# Patient Record
Sex: Female | Born: 1975 | Race: White | Hispanic: No | Marital: Married
Health system: Southern US, Community
[De-identification: ages and names within clinical notes are randomized; demographics above are authoritative.]

## PROBLEM LIST (undated history)

## (undated) DIAGNOSIS — U071 COVID-19: Secondary | ICD-10-CM

## (undated) DIAGNOSIS — J9621 Acute and chronic respiratory failure with hypoxia: Secondary | ICD-10-CM

## (undated) DIAGNOSIS — J449 Chronic obstructive pulmonary disease, unspecified: Secondary | ICD-10-CM

## (undated) DIAGNOSIS — J4489 Other specified chronic obstructive pulmonary disease: Secondary | ICD-10-CM

## (undated) DIAGNOSIS — J1282 Pneumonia due to coronavirus disease 2019: Secondary | ICD-10-CM

## (undated) DIAGNOSIS — J80 Acute respiratory distress syndrome: Secondary | ICD-10-CM

---

## 2019-11-10 ENCOUNTER — Inpatient Hospital Stay
Admit: 2019-11-10 | Discharge: 2019-12-04 | Disposition: A | Discharge status: Rehabilitation Facility | Payer: Managed Care, Other (non HMO) | Source: Ambulatory Visit | Attending: Internal Medicine | Admitting: Internal Medicine

## 2019-11-10 ENCOUNTER — Other Ambulatory Visit (HOSPITAL_COMMUNITY): Payer: Managed Care, Other (non HMO)

## 2019-11-10 ENCOUNTER — Institutional Professional Consult (permissible substitution) (HOSPITAL_COMMUNITY): Payer: Managed Care, Other (non HMO)

## 2019-11-10 DIAGNOSIS — J80 Acute respiratory distress syndrome: Secondary | ICD-10-CM | POA: Diagnosis present

## 2019-11-10 DIAGNOSIS — R1110 Vomiting, unspecified: Secondary | ICD-10-CM

## 2019-11-10 DIAGNOSIS — R109 Unspecified abdominal pain: Secondary | ICD-10-CM

## 2019-11-10 DIAGNOSIS — J45902 Unspecified asthma with status asthmaticus: Secondary | ICD-10-CM | POA: Diagnosis present

## 2019-11-10 DIAGNOSIS — J9621 Acute and chronic respiratory failure with hypoxia: Secondary | ICD-10-CM | POA: Diagnosis present

## 2019-11-10 DIAGNOSIS — R110 Nausea: Secondary | ICD-10-CM

## 2019-11-10 DIAGNOSIS — U071 COVID-19: Secondary | ICD-10-CM | POA: Diagnosis present

## 2019-11-10 DIAGNOSIS — J449 Chronic obstructive pulmonary disease, unspecified: Secondary | ICD-10-CM | POA: Diagnosis present

## 2019-11-10 DIAGNOSIS — Z431 Encounter for attention to gastrostomy: Secondary | ICD-10-CM

## 2019-11-10 HISTORY — DX: Acute respiratory distress syndrome: J80

## 2019-11-10 HISTORY — DX: Pneumonia due to coronavirus disease 2019: J12.82

## 2019-11-10 HISTORY — DX: Other specified chronic obstructive pulmonary disease: J44.89

## 2019-11-10 HISTORY — DX: Chronic obstructive pulmonary disease, unspecified: J44.9

## 2019-11-10 HISTORY — DX: COVID-19: U07.1

## 2019-11-10 HISTORY — DX: Acute and chronic respiratory failure with hypoxia: J96.21

## 2019-11-11 DIAGNOSIS — J80 Acute respiratory distress syndrome: Secondary | ICD-10-CM | POA: Diagnosis not present

## 2019-11-11 DIAGNOSIS — U071 COVID-19: Secondary | ICD-10-CM | POA: Diagnosis not present

## 2019-11-11 DIAGNOSIS — J1282 Pneumonia due to coronavirus disease 2019: Secondary | ICD-10-CM

## 2019-11-11 DIAGNOSIS — J9621 Acute and chronic respiratory failure with hypoxia: Secondary | ICD-10-CM | POA: Diagnosis not present

## 2019-11-11 DIAGNOSIS — J449 Chronic obstructive pulmonary disease, unspecified: Secondary | ICD-10-CM | POA: Diagnosis not present

## 2019-11-11 LAB — BLOOD GAS, ARTERIAL
Acid-Base Excess: 3.7 mmol/L — ABNORMAL HIGH (ref 0.0–2.0)
Bicarbonate: 28.7 mmol/L — ABNORMAL HIGH (ref 20.0–28.0)
FIO2: 45
O2 Saturation: 94.9 %
Patient temperature: 37
pCO2 arterial: 51.5 mmHg — ABNORMAL HIGH (ref 32.0–48.0)
pH, Arterial: 7.365 (ref 7.350–7.450)
pO2, Arterial: 77.7 mmHg — ABNORMAL LOW (ref 83.0–108.0)

## 2019-11-11 LAB — CBC WITH DIFFERENTIAL/PLATELET
Abs Immature Granulocytes: 0.68 10*3/uL — ABNORMAL HIGH (ref 0.00–0.07)
Basophils Absolute: 0.1 10*3/uL (ref 0.0–0.1)
Basophils Relative: 1 %
Eosinophils Absolute: 0.4 10*3/uL (ref 0.0–0.5)
Eosinophils Relative: 3 %
HCT: 32.2 % — ABNORMAL LOW (ref 36.0–46.0)
Hemoglobin: 9.8 g/dL — ABNORMAL LOW (ref 12.0–15.0)
Immature Granulocytes: 5 %
Lymphocytes Relative: 13 %
Lymphs Abs: 1.7 10*3/uL (ref 0.7–4.0)
MCH: 28.7 pg (ref 26.0–34.0)
MCHC: 30.4 g/dL (ref 30.0–36.0)
MCV: 94.2 fL (ref 80.0–100.0)
Monocytes Absolute: 0.9 10*3/uL (ref 0.1–1.0)
Monocytes Relative: 7 %
Neutro Abs: 9.9 10*3/uL — ABNORMAL HIGH (ref 1.7–7.7)
Neutrophils Relative %: 71 %
Platelets: 310 10*3/uL (ref 150–400)
RBC: 3.42 MIL/uL — ABNORMAL LOW (ref 3.87–5.11)
RDW: 13.9 % (ref 11.5–15.5)
WBC: 13.6 10*3/uL — ABNORMAL HIGH (ref 4.0–10.5)
nRBC: 0.2 % (ref 0.0–0.2)

## 2019-11-11 LAB — BASIC METABOLIC PANEL
Anion gap: 11 (ref 5–15)
BUN: 12 mg/dL (ref 6–20)
CO2: 26 mmol/L (ref 22–32)
Calcium: 9 mg/dL (ref 8.9–10.3)
Chloride: 104 mmol/L (ref 98–111)
Creatinine, Ser: 0.55 mg/dL (ref 0.44–1.00)
GFR calc Af Amer: >60 mL/min (ref >60)
GFR calc non Af Amer: >60 mL/min (ref >60)
Glucose, Bld: 133 mg/dL — ABNORMAL HIGH (ref 70–99)
Potassium: 3.9 mmol/L (ref 3.5–5.1)
Sodium: 141 mmol/L (ref 135–145)

## 2019-11-11 LAB — HEMOGLOBIN A1C
Hgb A1c MFr Bld: 5.8 % — ABNORMAL HIGH (ref 4.8–5.6)
Mean Plasma Glucose: 119.76 mg/dL

## 2019-11-11 LAB — VANCOMYCIN, RANDOM: Vancomycin Rm: <4

## 2019-11-11 NOTE — Consult Note (Signed)
Pulmonary Critical Care Medicine [facility] [place]  PULMONARY SERVICE  Date of Service: 11/11/2019  PULMONARY CRITICAL CARE CONSULT   Diana Bass  DXI:[id number]  DOB: 1976/01/19   DOA: 11/10/2019  Referring Physician: Carron Curie, MD  HPI: Diana Bass is a 44 y.o. female seen for follow up of Acute on Chronic Respiratory Failure.  Patient has multiple medical problems including chronic asthma Raynaud's phenomenon presented to the hospital because of increasing shortness of breath cough congestion.  Patient was diagnosed with COVID-19.  The patient was given prednisone initially however then had severe hypoxia leading back to the emergency department.  She was started on Decadron duo nebs albuterol and discharged once again presented back to the hospital decompensated and was initially placed on high flow then BiPAP and then intubated.  Subsequently she was not able to come off the ventilator despite having received Decadron convalescent plasma remdesivir azithromycin.  She ended up with a tracheostomy as well as a PEG and transferred to our facility for further management  Review of Systems:  ROS performed and is unremarkable other than noted above.  Past medical history: Asthma Raynaud's phenomenon COVID-19 Respiratory failure  Past surgical history: Tracheostomy PEG  Social history: Non-smoker No alcohol or drug abuse  Family history: Positive for Covid after Christmas apparently and several family members  Allergies: No known drug allergies  Medications: Reviewed on Rounds  Physical Exam:  Vitals: Temperature 97.8 pulse 114 respiratory rate 35 blood pressure is 163/60 saturations 100%  Ventilator Settings mode of ventilation assist control FiO2 45% tidal volume 450 PEEP 8  . General: Comfortable at this time . Eyes: Grossly normal lids, irises & conjunctiva . ENT: grossly tongue is normal . Neck: no obvious mass . Cardiovascular:  S1-S2 normal no gallop or rub . Respiratory: No rhonchi coarse breath sounds . Abdomen: Soft and nontender . Skin: no rash seen on limited exam . Musculoskeletal: not rigid . Psychiatric:unable to assess . Neurologic: no seizure no involuntary movements         Labs on Admission:  Basic Metabolic Panel: Recent Labs  Lab 11/11/19 0508  NA 141  K 3.9  CL 104  CO2 26  GLUCOSE 133*  BUN 12  CREATININE 0.55  CALCIUM 9.0    Recent Labs  Lab 11/11/19 0323  PHART 7.365  PCO2ART 51.5*  PO2ART 77.7*  HCO3 28.7*  O2SAT 94.9    Liver Function Tests: No results for input(s): AST, ALT, ALKPHOS, BILITOT, PROT, ALBUMIN in the last 168 hours. No results for input(s): LIPASE, AMYLASE in the last 168 hours. No results for input(s): AMMONIA in the last 168 hours.  CBC: Recent Labs  Lab 11/11/19 0508  WBC 13.6*  NEUTROABS 9.9*  HGB 9.8*  HCT 32.2*  MCV 94.2  PLT 310    Cardiac Enzymes: No results for input(s): CKTOTAL, CKMB, CKMBINDEX, TROPONINI in the last 168 hours.  BNP (last 3 results) No results for input(s): BNP in the last 8760 hours.  ProBNP (last 3 results) No results for input(s): PROBNP in the last 8760 hours.   Radiological Exams on Admission: DG ABDOMEN PEG TUBE LOCATION  Result Date: 11/10/2019 CLINICAL DATA:  Peg tube placement EXAM: ABDOMEN - 1 VIEW COMPARISON:  None. FINDINGS: Contrast material injected through the gastrostomy tube outlines the stomach and duodenum. IMPRESSION: Intraluminal position of gastrostomy tube. Electronically Signed   By: Deatra Robinson M.D.   On: 11/10/2019 19:41   DG CHEST PORT 1 VIEW  Result  Date: 11/10/2019 CLINICAL DATA:  COVID-19 infection EXAM: PORTABLE CHEST 1 VIEW COMPARISON:  None. FINDINGS: Tracheostomy tube tip is at the level of the clavicular heads. There are bilateral pulmonary opacities superimposed on shallow lung inflation. Cardiomediastinal contours are normal. Right subclavian approach central venous catheter  tip projects over the right atrium. IMPRESSION: 1. Bilateral pulmonary opacities superimposed on shallow lung inflation, consistent with reported diagnosis of COVID-19 pneumonia. 2. Tracheostomy tube tip at the level of the clavicular heads. 3. Central venous catheter tip projects within the right atrium. Electronically Signed   By: Ulyses Jarred M.D.   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 Acute on chronic respiratory failure with hypoxia plan is to continue with attempting weaning.  This morning patient was attempted on the NAG and did not tolerate.  Respiratory therapy will reassess again later today.  Last chest x-ray also shows bilateral opacities consistent with COVID-19 so there may be some time required for resolution 2. COVID-19 virus infection in resolution phase she still has significant respiratory deficits with bilateral pulmonary opacities and will require time to improve 3. Chronic obstructive asthma nebulizers as necessary.  Would prefer standing order 4. COVID-19 pneumonia treated we will continue with supportive care follow-up x-ray still showing significant infiltrates as noted above.  I have personally seen and evaluated the patient, evaluated laboratory and imaging results, formulated the assessment and plan and placed orders. The Patient requires high complexity decision making with multiple systems involvement.  Case was discussed on Rounds with the Respiratory Therapy Director and the Respiratory staff Time Spent 67minutes  Keyonna Comunale A Janavia Rottman, MD [facility] Pulmonary Critical Care Medicine Sleep  Medicine

## 2019-11-12 ENCOUNTER — Encounter: Payer: Self-pay | Admitting: Internal Medicine

## 2019-11-12 DIAGNOSIS — J449 Chronic obstructive pulmonary disease, unspecified: Secondary | ICD-10-CM | POA: Diagnosis not present

## 2019-11-12 DIAGNOSIS — U071 COVID-19: Secondary | ICD-10-CM | POA: Diagnosis not present

## 2019-11-12 DIAGNOSIS — J80 Acute respiratory distress syndrome: Secondary | ICD-10-CM | POA: Diagnosis not present

## 2019-11-12 DIAGNOSIS — J45902 Unspecified asthma with status asthmaticus: Secondary | ICD-10-CM

## 2019-11-12 DIAGNOSIS — J9621 Acute and chronic respiratory failure with hypoxia: Secondary | ICD-10-CM | POA: Diagnosis not present

## 2019-11-12 DIAGNOSIS — J1282 Pneumonia due to coronavirus disease 2019: Secondary | ICD-10-CM | POA: Diagnosis present

## 2019-11-12 NOTE — Progress Notes (Signed)
Pulmonary Critical Care Medicine [facility] [place]   PULMONARY CRITICAL CARE SERVICE  PROGRESS NOTE  Date of Service: 11/12/2019  Diana Bass  FYB:[id number]  DOB: 08-20-1976   DOA: 11/10/2019  Referring Physician: Carron Curie, MD  HPI: Diana Bass is a 44 y.o. female seen for follow up of Acute on Chronic Respiratory Failure.  Patient currently was attempted on the NAG but she did not tolerated.  She is having some tachypnea.  Spoke to her at length she does have a great deal of anxiety issues also.  After talking to her she did appear to calm down a little bit and we switched her over to pressure support which she was tolerating fairly well  Medications: Reviewed on Rounds  Physical Exam:  Vitals: Temperature 98.1 pulse 83 respiratory 24 blood pressure is 142/67 saturations 99%  Ventilator Settings on assist control FiO2 40% tidal volume 475 PEEP 8  . General: Comfortable at this time . Eyes: Grossly normal lids, irises & conjunctiva . ENT: grossly tongue is normal . Neck: no obvious mass . Cardiovascular: S1 S2 normal no gallop . Respiratory: No rhonchi no rales are noted at this time . Abdomen: soft . Skin: no rash seen on limited exam . Musculoskeletal: not rigid . Psychiatric:unable to assess . Neurologic: no seizure no involuntary movements         Lab Data:   Basic Metabolic Panel: Recent Labs  Lab 11/11/19 0508  NA 141  K 3.9  CL 104  CO2 26  GLUCOSE 133*  BUN 12  CREATININE 0.55  CALCIUM 9.0    ABG: Recent Labs  Lab 11/11/19 0323  PHART 7.365  PCO2ART 51.5*  PO2ART 77.7*  HCO3 28.7*  O2SAT 94.9    Liver Function Tests: No results for input(s): AST, ALT, ALKPHOS, BILITOT, PROT, ALBUMIN in the last 168 hours. No results for input(s): LIPASE, AMYLASE in the last 168 hours. No results for input(s): AMMONIA in the last 168 hours.  CBC: Recent Labs  Lab 11/11/19 0508  WBC 13.6*  NEUTROABS 9.9*  HGB 9.8*  HCT  32.2*  MCV 94.2  PLT 310    Cardiac Enzymes: No results for input(s): CKTOTAL, CKMB, CKMBINDEX, TROPONINI in the last 168 hours.  BNP (last 3 results) No results for input(s): BNP in the last 8760 hours.  ProBNP (last 3 results) No results for input(s): PROBNP in the last 8760 hours.  Radiological Exams: DG ABDOMEN PEG TUBE LOCATION  Result Date: 11/10/2019 CLINICAL DATA:  Peg tube placement EXAM: ABDOMEN - 1 VIEW COMPARISON:  None. FINDINGS: Contrast material injected through the gastrostomy tube outlines the stomach and duodenum. IMPRESSION: Intraluminal position of gastrostomy tube. Electronically Signed   By: Deatra Robinson M.D.   On: 11/10/2019 19:41   DG CHEST PORT 1 VIEW  Result Date: 11/10/2019 CLINICAL DATA:  COVID-19 infection EXAM: PORTABLE CHEST 1 VIEW COMPARISON:  None. FINDINGS: Tracheostomy tube tip is at the level of the clavicular heads. There are bilateral pulmonary opacities superimposed on shallow lung inflation. Cardiomediastinal contours are normal. Right subclavian approach central venous catheter tip projects over the right atrium. IMPRESSION: 1. Bilateral pulmonary opacities superimposed on shallow lung inflation, consistent with reported diagnosis of COVID-19 pneumonia. 2. Tracheostomy tube tip at the level of the clavicular heads. 3. Central venous catheter tip projects within the right atrium. Electronically Signed   By: Deatra Robinson M.D.   On: 11/10/2019 19:43    Assessment/Plan Active Problems:   Acute on chronic  respiratory failure with hypoxia ([place])   COVID-19 virus infection   Pneumonia due to COVID-19 virus   Acute respiratory distress syndrome (ARDS) due to COVID-19 virus (HCC)   Chronic obstructive asthma (with obstructive pulmonary disease) ([place])   1. Acute on chronic respiratory failure with hypoxia plan is to continue with the NAG titrate as tolerated 2. COVID-19 virus infection at baseline we will continue with present management 3. COVID-19  pneumonia slow resolution with persistent changes on the x-ray 4. ARDS slowly improving oxygen requirements are improving 5. Chronic obstructive asthma at baseline we will continue with supportive care   I have personally seen and evaluated the patient, evaluated laboratory and imaging results, formulated the assessment and plan and placed orders. The Patient requires high complexity decision making with multiple systems involvement.  Rounds were done with the Respiratory Therapy Director and Staff therapists and discussed with nursing staff also.  Time 35 minutes  Allyne Gee, MD [facility] Pulmonary Critical Care Medicine Sleep Medicine

## 2019-11-13 DIAGNOSIS — J80 Acute respiratory distress syndrome: Secondary | ICD-10-CM | POA: Diagnosis not present

## 2019-11-13 DIAGNOSIS — U071 COVID-19: Secondary | ICD-10-CM | POA: Diagnosis not present

## 2019-11-13 DIAGNOSIS — J9621 Acute and chronic respiratory failure with hypoxia: Secondary | ICD-10-CM | POA: Diagnosis not present

## 2019-11-13 DIAGNOSIS — J449 Chronic obstructive pulmonary disease, unspecified: Secondary | ICD-10-CM | POA: Diagnosis not present

## 2019-11-13 LAB — VANCOMYCIN, TROUGH: Vancomycin Tr: 16 ug/mL (ref 15–20)

## 2019-11-13 NOTE — Progress Notes (Signed)
Pulmonary Critical Care Medicine [facility] [place]   PULMONARY CRITICAL CARE SERVICE  PROGRESS NOTE  Date of Service: 11/13/2019  Diana Bass  GYK:[id number]  DOB: 08-Oct-1976   DOA: 11/10/2019  Referring Physician: Carron Curie, MD  HPI: Diana Bass is a 44 y.o. female seen for follow up of Acute on Chronic Respiratory Failure.  Patient right now is on pressure support or has been on 40% FiO2 16/8  Medications: Reviewed on Rounds  Physical Exam:  Vitals: Temperature is 98.4 pulse 109 respiratory rate 34 blood pressure is 158/72 saturations 97%  Ventilator Settings mode ventilation pressure support FiO2 40% pressure support 16 PEEP 8  . General: Comfortable at this time . Eyes: Grossly normal lids, irises & conjunctiva . ENT: grossly tongue is normal . Neck: no obvious mass . Cardiovascular: S1 S2 normal no gallop . Respiratory: No rhonchi no rales are noted at this time . Abdomen: soft . Skin: no rash seen on limited exam . Musculoskeletal: not rigid . Psychiatric:unable to assess . Neurologic: no seizure no involuntary movements         Lab Data:   Basic Metabolic Panel: Recent Labs  Lab 11/11/19 0508  NA 141  K 3.9  CL 104  CO2 26  GLUCOSE 133*  BUN 12  CREATININE 0.55  CALCIUM 9.0    ABG: Recent Labs  Lab 11/11/19 0323  PHART 7.365  PCO2ART 51.5*  PO2ART 77.7*  HCO3 28.7*  O2SAT 94.9    Liver Function Tests: No results for input(s): AST, ALT, ALKPHOS, BILITOT, PROT, ALBUMIN in the last 168 hours. No results for input(s): LIPASE, AMYLASE in the last 168 hours. No results for input(s): AMMONIA in the last 168 hours.  CBC: Recent Labs  Lab 11/11/19 0508  WBC 13.6*  NEUTROABS 9.9*  HGB 9.8*  HCT 32.2*  MCV 94.2  PLT 310    Cardiac Enzymes: No results for input(s): CKTOTAL, CKMB, CKMBINDEX, TROPONINI in the last 168 hours.  BNP (last 3 results) No results for input(s): BNP in the last 8760 hours.  ProBNP  (last 3 results) No results for input(s): PROBNP in the last 8760 hours.  Radiological Exams: No results found.  Assessment/Plan Active Problems:   Acute on chronic respiratory failure with hypoxia (HCC)   COVID-19 virus infection   Pneumonia due to COVID-19 virus   Acute respiratory distress syndrome (ARDS) due to COVID-19 virus (HCC)   Chronic obstructive asthma (with obstructive pulmonary disease) (HCC)   1. Acute on chronic respiratory failure hypoxia plan is to continue with the pressure support weaning and advance further today we will try on the NAG 2. COVID-19 virus infection treated in resolution phase 3. Pneumonia due to COVID-19 we will continue with supportive care follow radiologically 4. ARDS slow to improve 5. Chronic asthma patient will be continued on her inhalers as per RT   I have personally seen and evaluated the patient, evaluated laboratory and imaging results, formulated the assessment and plan and placed orders. The Patient requires high complexity decision making with multiple systems involvement.  Rounds were done with the Respiratory Therapy Director and Staff therapists and discussed with nursing staff also.  Time 35 minutes  Yevonne Pax, MD [facility] Pulmonary Critical Care Medicine Sleep Medicine

## 2019-11-14 DIAGNOSIS — J449 Chronic obstructive pulmonary disease, unspecified: Secondary | ICD-10-CM | POA: Diagnosis not present

## 2019-11-14 DIAGNOSIS — J80 Acute respiratory distress syndrome: Secondary | ICD-10-CM | POA: Diagnosis not present

## 2019-11-14 DIAGNOSIS — J9621 Acute and chronic respiratory failure with hypoxia: Secondary | ICD-10-CM | POA: Diagnosis not present

## 2019-11-14 DIAGNOSIS — U071 COVID-19: Secondary | ICD-10-CM | POA: Diagnosis not present

## 2019-11-14 NOTE — Progress Notes (Signed)
Pulmonary Critical Care Medicine [facility] [place]   PULMONARY CRITICAL CARE SERVICE  PROGRESS NOTE  Date of Service: 11/14/2019  Diana Bass  NLG:[id number]  DOB: May 18, 1976   DOA: 11/10/2019  Referring Physician: Carron Curie, MD  HPI: Diana Bass is a 44 y.o. female seen for follow up of Acute on Chronic Respiratory Failure.  Patient currently is on assist control mode has been on 45% FiO2 should be able to try spontaneous breathing today  Medications: Reviewed on Rounds  Physical Exam:  Vitals: Temperature 98.4 pulse 79 respiratory 21 blood pressure is 137/79 saturations 100%  Ventilator Settings on assist control FiO2 45% tidal volume 450 PEEP 8  . General: Comfortable at this time . Eyes: Grossly normal lids, irises & conjunctiva . ENT: grossly tongue is normal . Neck: no obvious mass . Cardiovascular: S1 S2 normal no gallop . Respiratory: No rhonchi no rales are noted at this time . Abdomen: soft . Skin: no rash seen on limited exam . Musculoskeletal: not rigid . Psychiatric:unable to assess . Neurologic: no seizure no involuntary movements         Lab Data:   Basic Metabolic Panel: Recent Labs  Lab 11/11/19 0508  NA 141  K 3.9  CL 104  CO2 26  GLUCOSE 133*  BUN 12  CREATININE 0.55  CALCIUM 9.0    ABG: Recent Labs  Lab 11/11/19 0323  PHART 7.365  PCO2ART 51.5*  PO2ART 77.7*  HCO3 28.7*  O2SAT 94.9    Liver Function Tests: No results for input(s): AST, ALT, ALKPHOS, BILITOT, PROT, ALBUMIN in the last 168 hours. No results for input(s): LIPASE, AMYLASE in the last 168 hours. No results for input(s): AMMONIA in the last 168 hours.  CBC: Recent Labs  Lab 11/11/19 0508  WBC 13.6*  NEUTROABS 9.9*  HGB 9.8*  HCT 32.2*  MCV 94.2  PLT 310    Cardiac Enzymes: No results for input(s): CKTOTAL, CKMB, CKMBINDEX, TROPONINI in the last 168 hours.  BNP (last 3 results) No results for input(s): BNP in the last 8760  hours.  ProBNP (last 3 results) No results for input(s): PROBNP in the last 8760 hours.  Radiological Exams: No results found.  Assessment/Plan Active Problems:   Acute on chronic respiratory failure with hypoxia (HCC)   COVID-19 virus infection   Pneumonia due to COVID-19 virus   Acute respiratory distress syndrome (ARDS) due to COVID-19 virus (HCC)   Chronic obstructive asthma (with obstructive pulmonary disease) (HCC)   1. Acute on chronic respiratory failure with hypoxia patient currently is on assist control on 45% FiO2 with a PEEP of 8 we will check a RSB I mechanics try to wean 2. COVID-19 virus infection treated continue with supportive care 3. Pneumonia due to COVID-19 at baseline 4. ARDS treated we will continue supportive care 5. Chronic obstructive asthma at baseline   I have personally seen and evaluated the patient, evaluated laboratory and imaging results, formulated the assessment and plan and placed orders. The Patient requires high complexity decision making with multiple systems involvement.  Rounds were done with the Respiratory Therapy Director and Staff therapists and discussed with nursing staff also.  Yevonne Pax, MD [facility] Pulmonary Critical Care Medicine Sleep Medicine

## 2019-11-15 DIAGNOSIS — J9621 Acute and chronic respiratory failure with hypoxia: Secondary | ICD-10-CM | POA: Diagnosis not present

## 2019-11-15 DIAGNOSIS — U071 COVID-19: Secondary | ICD-10-CM | POA: Diagnosis not present

## 2019-11-15 DIAGNOSIS — J80 Acute respiratory distress syndrome: Secondary | ICD-10-CM | POA: Diagnosis not present

## 2019-11-15 DIAGNOSIS — J449 Chronic obstructive pulmonary disease, unspecified: Secondary | ICD-10-CM | POA: Diagnosis not present

## 2019-11-15 NOTE — Progress Notes (Addendum)
Pulmonary Critical Care Medicine [facility] [place]   PULMONARY CRITICAL CARE SERVICE  PROGRESS NOTE  Date of Service: 11/15/2019  Diana Bass  VEH:[id number]  DOB: May 24, 1976   DOA: 11/10/2019  Referring Physician: Carron Curie, MD  HPI: Diana Bass is a 44 y.o. female seen for follow up of Acute on Chronic Respiratory Failure.  Patient mains on pressure support as tolerated 12/8 with an FiO2 of 45%.  Satting well no distress.  Medications: Reviewed on Rounds  Physical Exam:  Vitals: Pulse 109 respirations 30 BP 138/80 O2 sat 98% temp 98.6  Ventilator Settings per support 12/8 FiO2 45%  . General: Comfortable at this time . Eyes: Grossly normal lids, irises & conjunctiva . ENT: grossly tongue is normal . Neck: no obvious mass . Cardiovascular: S1 S2 normal no gallop . Respiratory: No rales or rhonchi noted . Abdomen: soft . Skin: no rash seen on limited exam . Musculoskeletal: not rigid . Psychiatric:unable to assess . Neurologic: no seizure no involuntary movements         Lab Data:   Basic Metabolic Panel: Recent Labs  Lab 11/11/19 0508  NA 141  K 3.9  CL 104  CO2 26  GLUCOSE 133*  BUN 12  CREATININE 0.55  CALCIUM 9.0    ABG: Recent Labs  Lab 11/11/19 0323  PHART 7.365  PCO2ART 51.5*  PO2ART 77.7*  HCO3 28.7*  O2SAT 94.9    Liver Function Tests: No results for input(s): AST, ALT, ALKPHOS, BILITOT, PROT, ALBUMIN in the last 168 hours. No results for input(s): LIPASE, AMYLASE in the last 168 hours. No results for input(s): AMMONIA in the last 168 hours.  CBC: Recent Labs  Lab 11/11/19 0508  WBC 13.6*  NEUTROABS 9.9*  HGB 9.8*  HCT 32.2*  MCV 94.2  PLT 310    Cardiac Enzymes: No results for input(s): CKTOTAL, CKMB, CKMBINDEX, TROPONINI in the last 168 hours.  BNP (last 3 results) No results for input(s): BNP in the last 8760 hours.  ProBNP (last 3 results) No results for input(s): PROBNP in the last 8760  hours.  Radiological Exams: No results found.  Assessment/Plan Active Problems:   Acute on chronic respiratory failure with hypoxia (HCC)   COVID-19 virus infection   Pneumonia due to COVID-19 virus   Acute respiratory distress syndrome (ARDS) due to COVID-19 virus (HCC)   Chronic obstructive asthma (with obstructive pulmonary disease) (HCC)   1. Acute on chronic respiratory failure with hypoxia patient doing well currently on pressure support 12/8 with an FiO2 of 45% we will continue aggressive pulmonary toilet supportive measures. 2. COVID-19 virus infection treated continue with supportive care 3. Pneumonia due to COVID-19 at baseline 4. ARDS treated we will continue supportive care 5. Chronic obstructive asthma at baseline   I have personally seen and evaluated the patient, evaluated laboratory and imaging results, formulated the assessment and plan and placed orders. The Patient requires high complexity decision making with multiple systems involvement.  Rounds were done with the Respiratory Therapy Director and Staff therapists and discussed with nursing staff also.  Yevonne Pax, MD [facility] Pulmonary Critical Care Medicine Sleep Medicine

## 2019-11-16 DIAGNOSIS — J80 Acute respiratory distress syndrome: Secondary | ICD-10-CM | POA: Diagnosis not present

## 2019-11-16 DIAGNOSIS — U071 COVID-19: Secondary | ICD-10-CM | POA: Diagnosis not present

## 2019-11-16 DIAGNOSIS — J449 Chronic obstructive pulmonary disease, unspecified: Secondary | ICD-10-CM | POA: Diagnosis not present

## 2019-11-16 DIAGNOSIS — J9621 Acute and chronic respiratory failure with hypoxia: Secondary | ICD-10-CM | POA: Diagnosis not present

## 2019-11-16 NOTE — Progress Notes (Addendum)
Pulmonary Critical Care Medicine [facility] [place]   PULMONARY CRITICAL CARE SERVICE  PROGRESS NOTE  Date of Service: 11/16/2019  Diana Bass  [id number]:[id number]  DOB: 14-Apr-1976   DOA: 11/10/2019  Referring Physician: Carron Curie, MD  HPI: Diana Bass is a 44 y.o. female seen for follow up of Acute on Chronic Respiratory Failure.  Patient has a 4 to 6-hour goal today on 5 L NAG satting well currently with no distress.  Medications: Reviewed on Rounds  Physical Exam:  Vitals: Pulse 98 respirations 26 BP 136/80 O2 sat 99% weight 26  Ventilator Settings 5 L NAG  . General: Comfortable at this time . Eyes: Grossly normal lids, irises & conjunctiva . ENT: grossly tongue is normal . Neck: no obvious mass . Cardiovascular: S1 S2 normal no gallop . Respiratory: No rales or rhonchi noted . Abdomen: soft . Skin: no rash seen on limited exam . Musculoskeletal: not rigid . Psychiatric:unable to assess . Neurologic: no seizure no involuntary movements         Lab Data:   Basic Metabolic Panel: Recent Labs  Lab 11/11/19 0508  NA 141  K 3.9  CL 104  CO2 26  GLUCOSE 133*  BUN 12  CREATININE 0.55  CALCIUM 9.0    ABG: Recent Labs  Lab 11/11/19 0323  PHART 7.365  PCO2ART 51.5*  PO2ART 77.7*  HCO3 28.7*  O2SAT 94.9    Liver Function Tests: No results for input(s): AST, ALT, ALKPHOS, BILITOT, PROT, ALBUMIN in the last 168 hours. No results for input(s): LIPASE, AMYLASE in the last 168 hours. No results for input(s): AMMONIA in the last 168 hours.  CBC: Recent Labs  Lab 11/11/19 0508  WBC 13.6*  NEUTROABS 9.9*  HGB 9.8*  HCT 32.2*  MCV 94.2  PLT 310    Cardiac Enzymes: No results for input(s): CKTOTAL, CKMB, CKMBINDEX, TROPONINI in the last 168 hours.  BNP (last 3 results) No results for input(s): BNP in the last 8760 hours.  ProBNP (last 3 results) No results for input(s): PROBNP in the last 8760 hours.  Radiological  Exams: No results found.  Assessment/Plan Active Problems:   Acute on chronic respiratory failure with hypoxia (HCC)   COVID-19 virus infection   Pneumonia due to COVID-19 virus   Acute respiratory distress syndrome (ARDS) due to COVID-19 virus (HCC)   Chronic obstructive asthma (with obstructive pulmonary disease) (HCC)   1. Acute on chronic respiratory failure with hypoxia patient remains on 5 L NAG protocol for 6 hours today.  Continue aggressive pulmonary toilet supportive measures. 2. COVID-19 virus infection treated continue with supportive care 3. Pneumonia due to COVID-19 at baseline 4. ARDS treated we will continue supportive care 5. Chronic obstructive asthma at baseline   I have personally seen and evaluated the patient, evaluated laboratory and imaging results, formulated the assessment and plan and placed orders. The Patient requires high complexity decision making with multiple systems involvement.  Rounds were done with the Respiratory Therapy Director and Staff therapists and discussed with nursing staff also.  Yevonne Pax, MD [facility] Pulmonary Critical Care Medicine Sleep Medicine

## 2019-11-17 DIAGNOSIS — J9621 Acute and chronic respiratory failure with hypoxia: Secondary | ICD-10-CM | POA: Diagnosis not present

## 2019-11-17 DIAGNOSIS — J449 Chronic obstructive pulmonary disease, unspecified: Secondary | ICD-10-CM | POA: Diagnosis not present

## 2019-11-17 DIAGNOSIS — U071 COVID-19: Secondary | ICD-10-CM | POA: Diagnosis not present

## 2019-11-17 DIAGNOSIS — J80 Acute respiratory distress syndrome: Secondary | ICD-10-CM | POA: Diagnosis not present

## 2019-11-17 NOTE — Progress Notes (Signed)
Pulmonary Critical Care Medicine [facility] [place]   PULMONARY CRITICAL CARE SERVICE  PROGRESS NOTE  Date of Service: 11/17/2019  Diana Bass  UXN:[id number]  DOB: 28-Aug-1976   DOA: 11/10/2019  Referring Physician: Carron Curie, MD  HPI: Diana Bass is a 44 y.o. female seen for follow up of Acute on Chronic Respiratory Failure.  Patient currently is on the NAG on 5 L oxygen good saturations are noted  Medications: Reviewed on Rounds  Physical Exam:  Vitals: Temperature is 98.0 pulse 107 respiratory rate 27 blood pressure is 139/83 saturations 99%  Ventilator Settings off the ventilator on the NAG  . General: Comfortable at this time . Eyes: Grossly normal lids, irises & conjunctiva . ENT: grossly tongue is normal . Neck: no obvious mass . Cardiovascular: S1 S2 normal no gallop . Respiratory: No rhonchi no rales noted at this time . Abdomen: soft . Skin: no rash seen on limited exam . Musculoskeletal: not rigid . Psychiatric:unable to assess . Neurologic: no seizure no involuntary movements         Lab Data:   Basic Metabolic Panel: Recent Labs  Lab 11/11/19 0508  NA 141  K 3.9  CL 104  CO2 26  GLUCOSE 133*  BUN 12  CREATININE 0.55  CALCIUM 9.0    ABG: Recent Labs  Lab 11/11/19 0323  PHART 7.365  PCO2ART 51.5*  PO2ART 77.7*  HCO3 28.7*  O2SAT 94.9    Liver Function Tests: No results for input(s): AST, ALT, ALKPHOS, BILITOT, PROT, ALBUMIN in the last 168 hours. No results for input(s): LIPASE, AMYLASE in the last 168 hours. No results for input(s): AMMONIA in the last 168 hours.  CBC: Recent Labs  Lab 11/11/19 0508  WBC 13.6*  NEUTROABS 9.9*  HGB 9.8*  HCT 32.2*  MCV 94.2  PLT 310    Cardiac Enzymes: No results for input(s): CKTOTAL, CKMB, CKMBINDEX, TROPONINI in the last 168 hours.  BNP (last 3 results) No results for input(s): BNP in the last 8760 hours.  ProBNP (last 3 results) No results for input(s):  PROBNP in the last 8760 hours.  Radiological Exams: No results found.  Assessment/Plan Active Problems:   Acute on chronic respiratory failure with hypoxia (HCC)   COVID-19 virus infection   Pneumonia due to COVID-19 virus   Acute respiratory distress syndrome (ARDS) due to COVID-19 virus (HCC)   Chronic obstructive asthma (with obstructive pulmonary disease) (HCC)   1. Acute on chronic respiratory failure with hypoxia plan is to continue to wean on NAG titrate as tolerated 2. COVID-19 virus infection treated improving 3. Pneumonia due to COVID-19 slowly improving 4. ARDS improving we will continue to monitor 5. Chronic obstructive asthma at baseline   I have personally seen and evaluated the patient, evaluated laboratory and imaging results, formulated the assessment and plan and placed orders. The Patient requires high complexity decision making with multiple systems involvement.  Rounds were done with the Respiratory Therapy Director and Staff therapists and discussed with nursing staff also.  Yevonne Pax, MD [facility] Pulmonary Critical Care Medicine Sleep Medicine

## 2019-11-18 DIAGNOSIS — U071 COVID-19: Secondary | ICD-10-CM | POA: Diagnosis not present

## 2019-11-18 DIAGNOSIS — J449 Chronic obstructive pulmonary disease, unspecified: Secondary | ICD-10-CM | POA: Diagnosis not present

## 2019-11-18 DIAGNOSIS — J80 Acute respiratory distress syndrome: Secondary | ICD-10-CM | POA: Diagnosis not present

## 2019-11-18 DIAGNOSIS — J9621 Acute and chronic respiratory failure with hypoxia: Secondary | ICD-10-CM | POA: Diagnosis not present

## 2019-11-18 NOTE — Progress Notes (Signed)
Pulmonary Critical Care Medicine [facility] [place]   PULMONARY CRITICAL CARE SERVICE  PROGRESS NOTE  Date of Service: 11/18/2019  Diana Bass  [id number]  DOB: 1975/11/24   DOA: 11/10/2019  Referring Physician: Carron Curie, MD  HPI: Diana Bass is a 44 y.o. female seen for follow up of Acute on Chronic Respiratory Failure.  Patient is on the NAG right now on 5 L with a goal of 16 hours  Medications: Reviewed on Rounds  Physical Exam:  Vitals: Temperature 97.1 pulse 97 respiratory 24 blood pressure is 147/80 saturations 97%  Ventilator Settings on the NAG 5 L oxygen  . General: Comfortable at this time . Eyes: Grossly normal lids, irises & conjunctiva . ENT: grossly tongue is normal . Neck: no obvious mass . Cardiovascular: S1 S2 normal no gallop . Respiratory: Scattered rhonchi expansion is equal . Abdomen: soft . Skin: no rash seen on limited exam . Musculoskeletal: not rigid . Psychiatric:unable to assess . Neurologic: no seizure no involuntary movements         Lab Data:   Basic Metabolic Panel: No results for input(s): NA, K, CL, CO2, GLUCOSE, BUN, CREATININE, CALCIUM, MG, PHOS in the last 168 hours.  ABG: No results for input(s): PHART, PCO2ART, PO2ART, HCO3, O2SAT in the last 168 hours.  Liver Function Tests: No results for input(s): AST, ALT, ALKPHOS, BILITOT, PROT, ALBUMIN in the last 168 hours. No results for input(s): LIPASE, AMYLASE in the last 168 hours. No results for input(s): AMMONIA in the last 168 hours.  CBC: No results for input(s): WBC, NEUTROABS, HGB, HCT, MCV, PLT in the last 168 hours.  Cardiac Enzymes: No results for input(s): CKTOTAL, CKMB, CKMBINDEX, TROPONINI in the last 168 hours.  BNP (last 3 results) No results for input(s): BNP in the last 8760 hours.  ProBNP (last 3 results) No results for input(s): PROBNP in the last 8760 hours.  Radiological Exams: No results  found.  Assessment/Plan Active Problems:   Acute on chronic respiratory failure with hypoxia (HCC)   COVID-19 virus infection   Pneumonia due to COVID-19 virus   Acute respiratory distress syndrome (ARDS) due to COVID-19 virus (HCC)   Chronic obstructive asthma (with obstructive pulmonary disease) (HCC)   1. Acute on chronic respiratory failure hypoxia continue with NAG 5 L wean for 16 hours total today 2. COVID-19 virus infection in resolution phase 3. Pneumonia due to COVID-19 treated improving 4. Acute respiratory distress improving slowly 5. Chronic asthma at baseline continue present management   I have personally seen and evaluated the patient, evaluated laboratory and imaging results, formulated the assessment and plan and placed orders. The Patient requires high complexity decision making with multiple systems involvement.  Rounds were done with the Respiratory Therapy Director and Staff therapists and discussed with nursing staff also.  Yevonne Pax, MD [facility] Pulmonary Critical Care Medicine Sleep Medicine

## 2019-11-19 ENCOUNTER — Encounter: Payer: Self-pay | Admitting: *Deleted

## 2019-11-19 DIAGNOSIS — J80 Acute respiratory distress syndrome: Secondary | ICD-10-CM | POA: Diagnosis not present

## 2019-11-19 DIAGNOSIS — U071 COVID-19: Secondary | ICD-10-CM | POA: Diagnosis not present

## 2019-11-19 DIAGNOSIS — J449 Chronic obstructive pulmonary disease, unspecified: Secondary | ICD-10-CM | POA: Diagnosis not present

## 2019-11-19 DIAGNOSIS — J9621 Acute and chronic respiratory failure with hypoxia: Secondary | ICD-10-CM | POA: Diagnosis not present

## 2019-11-19 NOTE — Congregational Nurse Program (Signed)
[id number]/called to room to visit and pray with patient./resp.failure/. prayers of strength and encouragment. Given. Patient happy with visit.

## 2019-11-19 NOTE — Progress Notes (Addendum)
Pulmonary Critical Care Medicine [facility] [place]   PULMONARY CRITICAL CARE SERVICE  PROGRESS NOTE  Date of Service: 11/19/2019  Diana Bass  [id number]  DOB: 07/24/1976   DOA: 11/10/2019  Referring Physician: Carron Curie, MD  HPI: Diana Bass is a 44 y.o. female seen for follow up of Acute on Chronic Respiratory Failure.  Patient has a 16-hour goal today on NAG 4 L currently satting well no fever or distress.  Medications: Reviewed on Rounds  Physical Exam:  Vitals: Pulse 103 respirations 21 BP 137/80 O2 sat 99% temp 98.9  Ventilator Settings NAG 4 L  . General: Comfortable at this time . Eyes: Grossly normal lids, irises & conjunctiva . ENT: grossly tongue is normal . Neck: no obvious mass . Cardiovascular: S1 S2 normal no gallop . Respiratory: No rales or rhonchi noted . Abdomen: soft . Skin: no rash seen on limited exam . Musculoskeletal: not rigid . Psychiatric:unable to assess . Neurologic: no seizure no involuntary movements         Lab Data:   Basic Metabolic Panel: No results for input(s): NA, K, CL, CO2, GLUCOSE, BUN, CREATININE, CALCIUM, MG, PHOS in the last 168 hours.  ABG: No results for input(s): PHART, PCO2ART, PO2ART, HCO3, O2SAT in the last 168 hours.  Liver Function Tests: No results for input(s): AST, ALT, ALKPHOS, BILITOT, PROT, ALBUMIN in the last 168 hours. No results for input(s): LIPASE, AMYLASE in the last 168 hours. No results for input(s): AMMONIA in the last 168 hours.  CBC: No results for input(s): WBC, NEUTROABS, HGB, HCT, MCV, PLT in the last 168 hours.  Cardiac Enzymes: No results for input(s): CKTOTAL, CKMB, CKMBINDEX, TROPONINI in the last 168 hours.  BNP (last 3 results) No results for input(s): BNP in the last 8760 hours.  ProBNP (last 3 results) No results for input(s): PROBNP in the last 8760 hours.  Radiological Exams: No results found.  Assessment/Plan Active Problems:   Acute  on chronic respiratory failure with hypoxia (HCC)   COVID-19 virus infection   Pneumonia due to COVID-19 virus   Acute respiratory distress syndrome (ARDS) due to COVID-19 virus (HCC)   Chronic obstructive asthma (with obstructive pulmonary disease) (HCC)   1. Acute on chronic respiratory failure hypoxia continue with NAG 4 L wean for 16 hours total today 2. COVID-19 virus infection in resolution phase 3. Pneumonia due to COVID-19 treated improving 4. Acute respiratory distress improving slowly 5. Chronic asthma at baseline continue present management   I have personally seen and evaluated the patient, evaluated laboratory and imaging results, formulated the assessment and plan and placed orders. The Patient requires high complexity decision making with multiple systems involvement.  Rounds were done with the Respiratory Therapy Director and Staff therapists and discussed with nursing staff also.  Yevonne Pax, MD [facility] Pulmonary Critical Care Medicine Sleep Medicine

## 2019-11-20 DIAGNOSIS — J449 Chronic obstructive pulmonary disease, unspecified: Secondary | ICD-10-CM | POA: Diagnosis not present

## 2019-11-20 DIAGNOSIS — J80 Acute respiratory distress syndrome: Secondary | ICD-10-CM | POA: Diagnosis not present

## 2019-11-20 DIAGNOSIS — U071 COVID-19: Secondary | ICD-10-CM | POA: Diagnosis not present

## 2019-11-20 DIAGNOSIS — J9621 Acute and chronic respiratory failure with hypoxia: Secondary | ICD-10-CM | POA: Diagnosis not present

## 2019-11-20 NOTE — Progress Notes (Signed)
Pulmonary Critical Care Medicine [facility] [place]   PULMONARY CRITICAL CARE SERVICE  PROGRESS NOTE  Date of Service: 11/20/2019  Diana Bass  [id number]  DOB: May 09, 1976   DOA: 11/10/2019  Referring Physician: Carron Curie, MD  HPI: Diana Bass is a 44 y.o. female seen for follow up of Acute on Chronic Respiratory Failure.  Patient is on the NAG right now appears to be comfortable without any distress  Medications: Reviewed on Rounds  Physical Exam:  Vitals: Temperature is 98.3 pulse 107 respiratory 15 blood pressure is 166/99 saturations 100%  Ventilator Settings off the ventilator on the NAG  . General: Comfortable at this time . Eyes: Grossly normal lids, irises & conjunctiva . ENT: grossly tongue is normal . Neck: no obvious mass . Cardiovascular: S1 S2 normal no gallop . Respiratory: No rhonchi no rales are noted at this time . Abdomen: soft . Skin: no rash seen on limited exam . Musculoskeletal: not rigid . Psychiatric:unable to assess . Neurologic: no seizure no involuntary movements         Lab Data:   Basic Metabolic Panel: No results for input(s): NA, K, CL, CO2, GLUCOSE, BUN, CREATININE, CALCIUM, MG, PHOS in the last 168 hours.  ABG: No results for input(s): PHART, PCO2ART, PO2ART, HCO3, O2SAT in the last 168 hours.  Liver Function Tests: No results for input(s): AST, ALT, ALKPHOS, BILITOT, PROT, ALBUMIN in the last 168 hours. No results for input(s): LIPASE, AMYLASE in the last 168 hours. No results for input(s): AMMONIA in the last 168 hours.  CBC: No results for input(s): WBC, NEUTROABS, HGB, HCT, MCV, PLT in the last 168 hours.  Cardiac Enzymes: No results for input(s): CKTOTAL, CKMB, CKMBINDEX, TROPONINI in the last 168 hours.  BNP (last 3 results) No results for input(s): BNP in the last 8760 hours.  ProBNP (last 3 results) No results for input(s): PROBNP in the last 8760 hours.  Radiological Exams: No  results found.  Assessment/Plan Active Problems:   Acute on chronic respiratory failure with hypoxia (HCC)   COVID-19 virus infection   Pneumonia due to COVID-19 virus   Acute respiratory distress syndrome (ARDS) due to COVID-19 virus (HCC)   Chronic obstructive asthma (with obstructive pulmonary disease) (HCC)   1. Acute on chronic respiratory failure hypoxia plan is to continue with weaning on the NAG today goal will be 24 hours 2. COVID-19 virus infection in resolution phase continue with supportive care 3. ARDS improving 4. Chronic asthma right now is at baseline we will continue to monitor   I have personally seen and evaluated the patient, evaluated laboratory and imaging results, formulated the assessment and plan and placed orders. The Patient requires high complexity decision making with multiple systems involvement.  Rounds were done with the Respiratory Therapy Director and Staff therapists and discussed with nursing staff also.  Yevonne Pax, MD [facility] Pulmonary Critical Care Medicine Sleep Medicine

## 2019-11-21 DIAGNOSIS — U071 COVID-19: Secondary | ICD-10-CM | POA: Diagnosis not present

## 2019-11-21 DIAGNOSIS — J80 Acute respiratory distress syndrome: Secondary | ICD-10-CM | POA: Diagnosis not present

## 2019-11-21 DIAGNOSIS — J449 Chronic obstructive pulmonary disease, unspecified: Secondary | ICD-10-CM | POA: Diagnosis not present

## 2019-11-21 DIAGNOSIS — J9621 Acute and chronic respiratory failure with hypoxia: Secondary | ICD-10-CM | POA: Diagnosis not present

## 2019-11-21 LAB — BASIC METABOLIC PANEL
Anion gap: 11 (ref 5–15)
BUN: 18 mg/dL (ref 6–20)
CO2: 36 mmol/L — ABNORMAL HIGH (ref 22–32)
Calcium: 9.7 mg/dL (ref 8.9–10.3)
Chloride: 93 mmol/L — ABNORMAL LOW (ref 98–111)
Creatinine, Ser: 0.55 mg/dL (ref 0.44–1.00)
GFR calc Af Amer: >60 mL/min (ref >60)
GFR calc non Af Amer: >60 mL/min (ref >60)
Glucose, Bld: 133 mg/dL — ABNORMAL HIGH (ref 70–99)
Potassium: 4.6 mmol/L (ref 3.5–5.1)
Sodium: 140 mmol/L (ref 135–145)

## 2019-11-21 LAB — CBC
HCT: 38.2 % (ref 36.0–46.0)
Hemoglobin: 11.4 g/dL — ABNORMAL LOW (ref 12.0–15.0)
MCH: 28.7 pg (ref 26.0–34.0)
MCHC: 29.8 g/dL — ABNORMAL LOW (ref 30.0–36.0)
MCV: 96.2 fL (ref 80.0–100.0)
Platelets: 295 10*3/uL (ref 150–400)
RBC: 3.97 MIL/uL (ref 3.87–5.11)
RDW: 15.4 % (ref 11.5–15.5)
WBC: 9.1 10*3/uL (ref 4.0–10.5)
nRBC: 0 % (ref 0.0–0.2)

## 2019-11-21 NOTE — Progress Notes (Addendum)
Pulmonary Critical Care Medicine [facility] [place]   PULMONARY CRITICAL CARE SERVICE  PROGRESS NOTE  Date of Service: 11/21/2019  Diana Bass  [id number]  DOB: 05/12/1976   DOA: 11/10/2019  Referring Physician: Carron Curie, MD  HPI: Diana Bass is a 44 y.o. female seen for follow up of Acute on Chronic Respiratory Failure.  Patient has been on 24 hours of NAG 4 L currently satting well no distress.  Medications: Reviewed on Rounds  Physical Exam:  Vitals: Pulse 103 respirations 19 BP 164/96 O2 sat 100% temp 98.6  Ventilator Settings NAG 4 L  . General: Comfortable at this time . Eyes: Grossly normal lids, irises & conjunctiva . ENT: grossly tongue is normal . Neck: no obvious mass . Cardiovascular: S1 S2 normal no gallop . Respiratory: No rales or rhonchi noted . Abdomen: soft . Skin: no rash seen on limited exam . Musculoskeletal: not rigid . Psychiatric:unable to assess . Neurologic: no seizure no involuntary movements         Lab Data:   Basic Metabolic Panel: Recent Labs  Lab 11/21/19 0521  NA 140  K 4.6  CL 93*  CO2 36*  GLUCOSE 133*  BUN 18  CREATININE 0.55  CALCIUM 9.7    ABG: No results for input(s): PHART, PCO2ART, PO2ART, HCO3, O2SAT in the last 168 hours.  Liver Function Tests: No results for input(s): AST, ALT, ALKPHOS, BILITOT, PROT, ALBUMIN in the last 168 hours. No results for input(s): LIPASE, AMYLASE in the last 168 hours. No results for input(s): AMMONIA in the last 168 hours.  CBC: Recent Labs  Lab 11/21/19 0521  WBC 9.1  HGB 11.4*  HCT 38.2  MCV 96.2  PLT 295    Cardiac Enzymes: No results for input(s): CKTOTAL, CKMB, CKMBINDEX, TROPONINI in the last 168 hours.  BNP (last 3 results) No results for input(s): BNP in the last 8760 hours.  ProBNP (last 3 results) No results for input(s): PROBNP in the last 8760 hours.  Radiological Exams: No results found.  Assessment/Plan Active  Problems:   Acute on chronic respiratory failure with hypoxia (HCC)   COVID-19 virus infection   Pneumonia due to COVID-19 virus   Acute respiratory distress syndrome (ARDS) due to COVID-19 virus (HCC)   Chronic obstructive asthma (with obstructive pulmonary disease) (HCC)   1. Acute on chronic respiratory failure hypoxia plan is to continue with weaning on the NAG today goal will be 24 hours 2. COVID-19 virus infection in resolution phase continue with supportive care 3. ARDS improving 4. Chronic asthma right now is at baseline we will continue to monitor   I have personally seen and evaluated the patient, evaluated laboratory and imaging results, formulated the assessment and plan and placed orders. The Patient requires high complexity decision making with multiple systems involvement.  Rounds were done with the Respiratory Therapy Director and Staff therapists and discussed with nursing staff also.  Yevonne Pax, MD [facility] Pulmonary Critical Care Medicine Sleep Medicine

## 2019-11-22 ENCOUNTER — Other Ambulatory Visit (HOSPITAL_COMMUNITY): Payer: Managed Care, Other (non HMO)

## 2019-11-22 DIAGNOSIS — J449 Chronic obstructive pulmonary disease, unspecified: Secondary | ICD-10-CM | POA: Diagnosis not present

## 2019-11-22 DIAGNOSIS — J80 Acute respiratory distress syndrome: Secondary | ICD-10-CM | POA: Diagnosis not present

## 2019-11-22 DIAGNOSIS — J9621 Acute and chronic respiratory failure with hypoxia: Secondary | ICD-10-CM | POA: Diagnosis not present

## 2019-11-22 DIAGNOSIS — U071 COVID-19: Secondary | ICD-10-CM | POA: Diagnosis not present

## 2019-11-22 NOTE — Progress Notes (Addendum)
Pulmonary Critical Care Medicine [facility] [place]   PULMONARY CRITICAL CARE SERVICE  PROGRESS NOTE  Date of Service: 11/22/2019  Diana Bass  [id number]  DOB: 07-31-1976   DOA: 11/10/2019  Referring Physician: Carron Curie, MD  HPI: Diana Bass is a 44 y.o. female seen for follow up of Acute on Chronic Respiratory Failure.  Patient is on NAG 4 L satting well no fever distress.  Medications: Reviewed on Rounds  Physical Exam:  Vitals: Pulse 89 respirations 20 BP 165/95 O2 sat 100% temp 97.6  Ventilator Settings NAG 4 L  . General: Comfortable at this time . Eyes: Grossly normal lids, irises & conjunctiva . ENT: grossly tongue is normal . Neck: no obvious mass . Cardiovascular: S1 S2 normal no gallop . Respiratory: No rales rhonchi noted . Abdomen: soft . Skin: no rash seen on limited exam . Musculoskeletal: not rigid . Psychiatric:unable to assess . Neurologic: no seizure no involuntary movements         Lab Data:   Basic Metabolic Panel: Recent Labs  Lab 11/21/19 0521  NA 140  K 4.6  CL 93*  CO2 36*  GLUCOSE 133*  BUN 18  CREATININE 0.55  CALCIUM 9.7    ABG: No results for input(s): PHART, PCO2ART, PO2ART, HCO3, O2SAT in the last 168 hours.  Liver Function Tests: No results for input(s): AST, ALT, ALKPHOS, BILITOT, PROT, ALBUMIN in the last 168 hours. No results for input(s): LIPASE, AMYLASE in the last 168 hours. No results for input(s): AMMONIA in the last 168 hours.  CBC: Recent Labs  Lab 11/21/19 0521  WBC 9.1  HGB 11.4*  HCT 38.2  MCV 96.2  PLT 295    Cardiac Enzymes: No results for input(s): CKTOTAL, CKMB, CKMBINDEX, TROPONINI in the last 168 hours.  BNP (last 3 results) No results for input(s): BNP in the last 8760 hours.  ProBNP (last 3 results) No results for input(s): PROBNP in the last 8760 hours.  Radiological Exams: DG CHEST PORT 1 VIEW  Result Date: 11/22/2019 CLINICAL DATA:  COVID-19  pneumonia EXAM: PORTABLE CHEST 1 VIEW COMPARISON:  11/10/2019 FINDINGS: Tracheostomy tube noted. Very low lung volumes are present. Improved bilateral interstitial and airspace opacity compared to prior. Mild enlargement of the cardiopericardial silhouette. Interval removal of the right central lung. IMPRESSION: 1. Improved bilateral interstitial and airspace opacities. 2. Mild enlargement of the cardiopericardial silhouette. 3. Tracheostomy tube noted. Electronically Signed   By: Gaylyn Rong M.D.   On: 11/22/2019 12:36    Assessment/Plan Active Problems:   Acute on chronic respiratory failure with hypoxia (HCC)   COVID-19 virus infection   Pneumonia due to COVID-19 virus   Acute respiratory distress syndrome (ARDS) due to COVID-19 virus (HCC)   Chronic obstructive asthma (with obstructive pulmonary disease) (HCC)   1. Acute on chronic respiratory failure hypoxia plan is to continue with weaning on the NAG today goal will be 48 hours 2. COVID-19 virus infection in resolution phase continue with supportive care 3. ARDS improving 4. Chronic asthma right now is at baseline we will continue to monitor   I have personally seen and evaluated the patient, evaluated laboratory and imaging results, formulated the assessment and plan and placed orders. The Patient requires high complexity decision making with multiple systems involvement.  Rounds were done with the Respiratory Therapy Director and Staff therapists and discussed with nursing staff also.  Yevonne Pax, MD [facility] Pulmonary Critical Care Medicine Sleep Medicine

## 2019-11-23 ENCOUNTER — Other Ambulatory Visit (HOSPITAL_COMMUNITY): Payer: Managed Care, Other (non HMO)

## 2019-11-23 DIAGNOSIS — J449 Chronic obstructive pulmonary disease, unspecified: Secondary | ICD-10-CM | POA: Diagnosis not present

## 2019-11-23 DIAGNOSIS — J80 Acute respiratory distress syndrome: Secondary | ICD-10-CM | POA: Diagnosis not present

## 2019-11-23 DIAGNOSIS — J9621 Acute and chronic respiratory failure with hypoxia: Secondary | ICD-10-CM | POA: Diagnosis not present

## 2019-11-23 DIAGNOSIS — U071 COVID-19: Secondary | ICD-10-CM | POA: Diagnosis not present

## 2019-11-23 NOTE — Progress Notes (Addendum)
Pulmonary Critical Care Medicine [place]   PULMONARY CRITICAL CARE SERVICE  PROGRESS NOTE  Date of Service: 11/23/2019  Diana Bass  [id number]  DOB: September 06, 1976   DOA: 11/10/2019  Referring Physician: Merton Border, MD  HPI: Diana Bass is a 44 y.o. female seen for follow up of Acute on Chronic Respiratory Failure.  Patient remains on NAG 3 L satting well no fever or distress.  Medications: Reviewed on Rounds  Physical Exam:  Vitals: Pulse 104 respirations 24 BP 139/97 O2 sat 97% temp 97.8  Ventilator Settings NAG 3 L  . General: Comfortable at this time . Eyes: Grossly normal lids, irises & conjunctiva . ENT: grossly tongue is normal . Neck: no obvious mass . Cardiovascular: S1 S2 normal no gallop . Respiratory: No rales or rhonchi noted . Abdomen: soft . Skin: no rash seen on limited exam . Musculoskeletal: not rigid . Psychiatric:unable to assess . Neurologic: no seizure no involuntary movements         Lab Data:   Basic Metabolic Panel: Recent Labs  Lab 11/21/19 0521  NA 140  K 4.6  CL 93*  CO2 36*  GLUCOSE 133*  BUN 18  CREATININE 0.55  CALCIUM 9.7    ABG: No results for input(s): PHART, PCO2ART, PO2ART, HCO3, O2SAT in the last 168 hours.  Liver Function Tests: No results for input(s): AST, ALT, ALKPHOS, BILITOT, PROT, ALBUMIN in the last 168 hours. No results for input(s): LIPASE, AMYLASE in the last 168 hours. No results for input(s): AMMONIA in the last 168 hours.  CBC: Recent Labs  Lab 11/21/19 0521  WBC 9.1  HGB 11.4*  HCT 38.2  MCV 96.2  PLT 295    Cardiac Enzymes: No results for input(s): CKTOTAL, CKMB, CKMBINDEX, TROPONINI in the last 168 hours.  BNP (last 3 results) No results for input(s): BNP in the last 8760 hours.  ProBNP (last 3 results) No results for input(s): PROBNP in the last 8760 hours.  Radiological Exams: DG Abd 1 View  Result Date: 11/23/2019 CLINICAL DATA:  Nausea,  Vomiting, Abdominal pain EXAM: ABDOMEN - 1 VIEW COMPARISON:  Abdominal radiograph 11/10/2019 FINDINGS: There are no dilated loops of bowel to suggest obstruction. No supine evidence for free air. A gastrostomy tube is seen in the left hemiabdomen. No unexpected radiopaque calcification. Opacities at the bilateral lung bases better assessed on concurrent chest radiograph. No acute finding in the visualized skeleton. IMPRESSION: Nonobstructive bowel gas pattern. Electronically Signed   By: Audie Pinto M.D.   On: 11/23/2019 12:51   DG CHEST PORT 1 VIEW  Result Date: 11/22/2019 CLINICAL DATA:  COVID-19 pneumonia EXAM: PORTABLE CHEST 1 VIEW COMPARISON:  11/10/2019 FINDINGS: Tracheostomy tube noted. Very low lung volumes are present. Improved bilateral interstitial and airspace opacity compared to prior. Mild enlargement of the cardiopericardial silhouette. Interval removal of the right central lung. IMPRESSION: 1. Improved bilateral interstitial and airspace opacities. 2. Mild enlargement of the cardiopericardial silhouette. 3. Tracheostomy tube noted. Electronically Signed   By: Van Clines M.D.   On: 11/22/2019 12:36    Assessment/Plan Active Problems:   Acute on chronic respiratory failure with hypoxia (HCC)   COVID-19 virus infection   Pneumonia due to COVID-19 virus   Acute respiratory distress syndrome (ARDS) due to COVID-19 virus (HCC)   Chronic obstructive asthma (with obstructive pulmonary disease) ([place])   1. Acute on chronic respiratory failure hypoxia plan is to continue with weaning on the NAG currently on 3 L continue  supportive measures and pulmonary toilet. 2. COVID-19 virus infection in resolution phase continue with supportive care 3. ARDS improving 4. Chronic asthma right now is at baseline we will continue to monitor   I have personally seen and evaluated the patient, evaluated laboratory and imaging results, formulated the assessment and plan and placed orders. The  Patient requires high complexity decision making with multiple systems involvement.  Rounds were done with the Respiratory Therapy Director and Staff therapists and discussed with nursing staff also.  Yevonne Pax, MD [facility] Pulmonary Critical Care Medicine Sleep Medicine

## 2019-11-24 DIAGNOSIS — U071 COVID-19: Secondary | ICD-10-CM | POA: Diagnosis not present

## 2019-11-24 DIAGNOSIS — J80 Acute respiratory distress syndrome: Secondary | ICD-10-CM | POA: Diagnosis not present

## 2019-11-24 DIAGNOSIS — J9621 Acute and chronic respiratory failure with hypoxia: Secondary | ICD-10-CM | POA: Diagnosis not present

## 2019-11-24 DIAGNOSIS — J449 Chronic obstructive pulmonary disease, unspecified: Secondary | ICD-10-CM | POA: Diagnosis not present

## 2019-11-24 NOTE — Progress Notes (Signed)
Pulmonary Critical Care Medicine [facility] [place]   PULMONARY CRITICAL CARE SERVICE  PROGRESS NOTE  Date of Service: 11/24/2019  Diana Bass  ZOX:[id number]  DOB: 1976/06/22   DOA: 11/10/2019  Referring Physician: Carron Curie, MD  HPI: Diana Bass is a 44 y.o. female seen for follow up of Acute on Chronic Respiratory Failure.  Patient is off the ventilator on the NAG has been using 4 L and is on the fourth day ready for change in the tracheostomy  Medications: Reviewed on Rounds  Physical Exam:  Vitals: Temperature 98.2 pulse 100 respiratory rate 17 blood pressure is 158/92 saturations 100%  Ventilator Settings off the ventilator on the NAG  . General: Comfortable at this time . Eyes: Grossly normal lids, irises & conjunctiva . ENT: grossly tongue is normal . Neck: no obvious mass . Cardiovascular: S1 S2 normal no gallop . Respiratory: Coarse presence of a few scattered rhonchi . Abdomen: soft . Skin: no rash seen on limited exam . Musculoskeletal: not rigid . Psychiatric:unable to assess . Neurologic: no seizure no involuntary movements         Lab Data:   Basic Metabolic Panel: Recent Labs  Lab 11/21/19 0521  NA 140  K 4.6  CL 93*  CO2 36*  GLUCOSE 133*  BUN 18  CREATININE 0.55  CALCIUM 9.7    ABG: No results for input(s): PHART, PCO2ART, PO2ART, HCO3, O2SAT in the last 168 hours.  Liver Function Tests: No results for input(s): AST, ALT, ALKPHOS, BILITOT, PROT, ALBUMIN in the last 168 hours. No results for input(s): LIPASE, AMYLASE in the last 168 hours. No results for input(s): AMMONIA in the last 168 hours.  CBC: Recent Labs  Lab 11/21/19 0521  WBC 9.1  HGB 11.4*  HCT 38.2  MCV 96.2  PLT 295    Cardiac Enzymes: No results for input(s): CKTOTAL, CKMB, CKMBINDEX, TROPONINI in the last 168 hours.  BNP (last 3 results) No results for input(s): BNP in the last 8760 hours.  ProBNP (last 3 results) No results  for input(s): PROBNP in the last 8760 hours.  Radiological Exams: DG Abd 1 View  Result Date: 11/23/2019 CLINICAL DATA:  Nausea, Vomiting, Abdominal pain EXAM: ABDOMEN - 1 VIEW COMPARISON:  Abdominal radiograph 11/10/2019 FINDINGS: There are no dilated loops of bowel to suggest obstruction. No supine evidence for free air. A gastrostomy tube is seen in the left hemiabdomen. No unexpected radiopaque calcification. Opacities at the bilateral lung bases better assessed on concurrent chest radiograph. No acute finding in the visualized skeleton. IMPRESSION: Nonobstructive bowel gas pattern. Electronically Signed   By: Emmaline Kluver M.D.   On: 11/23/2019 12:51    Assessment/Plan Active Problems:   Acute on chronic respiratory failure with hypoxia (HCC)   COVID-19 virus infection   Pneumonia due to COVID-19 virus   Acute respiratory distress syndrome (ARDS) due to COVID-19 virus (HCC)   Chronic obstructive asthma (with obstructive pulmonary disease) (HCC)   1. Acute on chronic respiratory failure with hypoxia continue with the NAG right now is on 4-day trial of the NAG we will go ahead and change out the tracheostomy today 2. COVID-19 virus infection treated continue with supportive care 3. Pneumonia due to COVID-19 clinically is improving we will continue to follow 4. ARDS treated we will continue with present management 5. Chronic obstructive asthma no change we will continue present management   I have personally seen and evaluated the patient, evaluated laboratory and imaging results, formulated the  assessment and plan and placed orders. The Patient requires high complexity decision making with multiple systems involvement.  Rounds were done with the Respiratory Therapy Director and Staff therapists and discussed with nursing staff also.  Allyne Gee, MD [facility] Pulmonary Critical Care Medicine Sleep Medicine

## 2019-11-25 DIAGNOSIS — J80 Acute respiratory distress syndrome: Secondary | ICD-10-CM | POA: Diagnosis not present

## 2019-11-25 DIAGNOSIS — J9621 Acute and chronic respiratory failure with hypoxia: Secondary | ICD-10-CM | POA: Diagnosis not present

## 2019-11-25 DIAGNOSIS — J449 Chronic obstructive pulmonary disease, unspecified: Secondary | ICD-10-CM | POA: Diagnosis not present

## 2019-11-25 DIAGNOSIS — U071 COVID-19: Secondary | ICD-10-CM | POA: Diagnosis not present

## 2019-11-25 NOTE — Progress Notes (Signed)
Pulmonary Critical Care Medicine [facility] [place]   PULMONARY CRITICAL CARE SERVICE  PROGRESS NOTE  Date of Service: 11/25/2019  Sritha Chauncey  JGG:[id number]  DOB: [birth year]   DOA: 11/10/2019  Referring Physician: Carron Curie, MD  HPI: [name] is a 44 y.o. female seen for follow up of Acute on Chronic Respiratory Failure.  Patient currently is capping today will be 24 hours right now is on 3 L of oxygen  Medications: Reviewed on Rounds  Physical Exam:  Vitals: Temperature 98.4 pulse 116 respiratory rate 16 blood pressure is 149/98 saturations 99%  Ventilator Settings capping on ventilator on 3 L  . General: Comfortable at this time . Eyes: Grossly normal lids, irises & conjunctiva . ENT: grossly tongue is normal . Neck: no obvious mass . Cardiovascular: S1 S2 normal no gallop . Respiratory: No rhonchi no rales are noted at this time . Abdomen: soft . Skin: no rash seen on limited exam . Musculoskeletal: not rigid . Psychiatric:unable to assess . Neurologic: no seizure no involuntary movements         Lab Data:   Basic Metabolic Panel: Recent Labs  Lab 11/21/19 0521  NA 140  K 4.6  CL 93*  CO2 36*  GLUCOSE 133*  BUN 18  CREATININE 0.55  CALCIUM 9.7    ABG: No results for input(s): PHART, PCO2ART, PO2ART, HCO3, O2SAT in the last 168 hours.  Liver Function Tests: No results for input(s): AST, ALT, ALKPHOS, BILITOT, PROT, ALBUMIN in the last 168 hours. No results for input(s): LIPASE, AMYLASE in the last 168 hours. No results for input(s): AMMONIA in the last 168 hours.  CBC: Recent Labs  Lab 11/21/19 0521  WBC 9.1  HGB 11.4*  HCT 38.2  MCV 96.2  PLT 295    Cardiac Enzymes: No results for input(s): CKTOTAL, CKMB, CKMBINDEX, TROPONINI in the last 168 hours.  BNP (last 3 results) No results for input(s): BNP in the last 8760 hours.  ProBNP (last 3 results) No results for input(s): PROBNP in the last 8760  hours.  Radiological Exams: No results found.  Assessment/Plan Active Problems:   Acute on chronic respiratory failure with hypoxia (HCC)   COVID-19 virus infection   Pneumonia due to COVID-19 virus   Acute respiratory distress syndrome (ARDS) due to COVID-19 virus (HCC)   Chronic obstructive asthma (with obstructive pulmonary disease) (HCC)   1. Acute on chronic respiratory failure with hypoxia plan is to continue with capping trials as tolerated patient will be completing 24 hours today 2. COVID-19 virus infection treated resolving we will continue with supportive care. 3. Pneumonia due to COVID-19 treated we will continue with present management 4. ARDS improving we will continue to follow 5. COPD asthma at baseline we will continue with supportive care   I have personally seen and evaluated the patient, evaluated laboratory and imaging results, formulated the assessment and plan and placed orders. The Patient requires high complexity decision making with multiple systems involvement.  Rounds were done with the Respiratory Therapy Director and Staff therapists and discussed with nursing staff also.  Yevonne Pax, MD [facility] Pulmonary Critical Care Medicine Sleep Medicine

## 2019-11-26 DIAGNOSIS — J9621 Acute and chronic respiratory failure with hypoxia: Secondary | ICD-10-CM | POA: Diagnosis not present

## 2019-11-26 DIAGNOSIS — J449 Chronic obstructive pulmonary disease, unspecified: Secondary | ICD-10-CM | POA: Diagnosis not present

## 2019-11-26 DIAGNOSIS — U071 COVID-19: Secondary | ICD-10-CM | POA: Diagnosis not present

## 2019-11-26 DIAGNOSIS — J80 Acute respiratory distress syndrome: Secondary | ICD-10-CM | POA: Diagnosis not present

## 2019-11-26 NOTE — Progress Notes (Signed)
Pulmonary Critical Care Medicine [facility] [place]   PULMONARY CRITICAL CARE SERVICE  PROGRESS NOTE  Date of Service: 11/26/2019  Diana Bass  [id number]  DOB: 1976/09/23   DOA: 11/10/2019  Referring Physician: Carron Curie, MD  HPI: Diana Bass is a 44 y.o. female seen for follow up of Acute on Chronic Respiratory Failure.  Patient is capping will be doing 48 hours should be able to proceed to decannulation today  Medications: Reviewed on Rounds  Physical Exam:  Vitals: Temperature is 98.5 pulse 106 respiratory 24 blood pressure is 146/88 saturations 98%  Ventilator Settings capping off the ventilator  . General: Comfortable at this time . Eyes: Grossly normal lids, irises & conjunctiva . ENT: grossly tongue is normal . Neck: no obvious mass . Cardiovascular: S1 S2 normal no gallop . Respiratory: No rhonchi no rales are noted at this time . Abdomen: soft . Skin: no rash seen on limited exam . Musculoskeletal: not rigid . Psychiatric:unable to assess . Neurologic: no seizure no involuntary movements         Lab Data:   Basic Metabolic Panel: Recent Labs  Lab 11/21/19 0521  NA 140  K 4.6  CL 93*  CO2 36*  GLUCOSE 133*  BUN 18  CREATININE 0.55  CALCIUM 9.7    ABG: No results for input(s): PHART, PCO2ART, PO2ART, HCO3, O2SAT in the last 168 hours.  Liver Function Tests: No results for input(s): AST, ALT, ALKPHOS, BILITOT, PROT, ALBUMIN in the last 168 hours. No results for input(s): LIPASE, AMYLASE in the last 168 hours. No results for input(s): AMMONIA in the last 168 hours.  CBC: Recent Labs  Lab 11/21/19 0521  WBC 9.1  HGB 11.4*  HCT 38.2  MCV 96.2  PLT 295    Cardiac Enzymes: No results for input(s): CKTOTAL, CKMB, CKMBINDEX, TROPONINI in the last 168 hours.  BNP (last 3 results) No results for input(s): BNP in the last 8760 hours.  ProBNP (last 3 results) No results for input(s): PROBNP in the last 8760  hours.  Radiological Exams: No results found.  Assessment/Plan Active Problems:   Acute on chronic respiratory failure with hypoxia (HCC)   COVID-19 virus infection   Pneumonia due to COVID-19 virus   Acute respiratory distress syndrome (ARDS) due to COVID-19 virus (HCC)   Chronic obstructive asthma (with obstructive pulmonary disease) (HCC)   1. Acute on chronic respiratory failure hypoxia plan is to proceed to decannulation patient is complete protocol 48-hour capping trials and is actually doing quite well 2. COVID-19 virus infection treated we will continue with present management 3. Pneumonia due to COVID-19 treated 4. ARDS resolving we will continue to follow 5. Chronic obstructive asthma at baseline we will continue with present management   I have personally seen and evaluated the patient, evaluated laboratory and imaging results, formulated the assessment and plan and placed orders. The Patient requires high complexity decision making with multiple systems involvement.  Rounds were done with the Respiratory Therapy Director and Staff therapists and discussed with nursing staff also.  Yevonne Pax, MD [facility] Pulmonary Critical Care Medicine Sleep Medicine

## 2019-11-27 DIAGNOSIS — U071 COVID-19: Secondary | ICD-10-CM | POA: Diagnosis not present

## 2019-11-27 DIAGNOSIS — J449 Chronic obstructive pulmonary disease, unspecified: Secondary | ICD-10-CM | POA: Diagnosis not present

## 2019-11-27 DIAGNOSIS — J80 Acute respiratory distress syndrome: Secondary | ICD-10-CM | POA: Diagnosis not present

## 2019-11-27 DIAGNOSIS — J9621 Acute and chronic respiratory failure with hypoxia: Secondary | ICD-10-CM | POA: Diagnosis not present

## 2019-11-27 NOTE — Progress Notes (Signed)
Pulmonary Critical Care Medicine [facility] [place]   PULMONARY CRITICAL CARE SERVICE  PROGRESS NOTE  Date of Service: 11/27/2019  Diana Bass  [id number]  DOB: [birth year]   DOA: 11/10/2019  Referring Physician: Carron Curie, MD  HPI: Diana Bass is a 44 y.o. female seen for follow up of Acute on Chronic Respiratory Failure.  Patient is decannulated doing well right now is only requiring 2 L of oxygen.  Medications: Reviewed on Rounds  Physical Exam:  Vitals: Temperature is 99.0 pulse 104 respiratory rate 17 blood pressure is 166/79  Ventilator Settings off the ventilator on nasal cannula  . General: Comfortable at this time . Eyes: Grossly normal lids, irises & conjunctiva . ENT: grossly tongue is normal . Neck: no obvious mass . Cardiovascular: S1 S2 normal no gallop . Respiratory: No rhonchi no rales . Abdomen: soft . Skin: no rash seen on limited exam . Musculoskeletal: not rigid . Psychiatric:unable to assess . Neurologic: no seizure no involuntary movements         Lab Data:   Basic Metabolic Panel: Recent Labs  Lab 11/21/19 0521  NA 140  K 4.6  CL 93*  CO2 36*  GLUCOSE 133*  BUN 18  CREATININE 0.55  CALCIUM 9.7    ABG: No results for input(s): PHART, PCO2ART, PO2ART, HCO3, O2SAT in the last 168 hours.  Liver Function Tests: No results for input(s): AST, ALT, ALKPHOS, BILITOT, PROT, ALBUMIN in the last 168 hours. No results for input(s): LIPASE, AMYLASE in the last 168 hours. No results for input(s): AMMONIA in the last 168 hours.  CBC: Recent Labs  Lab 11/21/19 0521  WBC 9.1  HGB 11.4*  HCT 38.2  MCV 96.2  PLT 295    Cardiac Enzymes: No results for input(s): CKTOTAL, CKMB, CKMBINDEX, TROPONINI in the last 168 hours.  BNP (last 3 results) No results for input(s): BNP in the last 8760 hours.  ProBNP (last 3 results) No results for input(s): PROBNP in the last 8760 hours.  Radiological Exams: No  results found.  Assessment/Plan Active Problems:   Acute on chronic respiratory failure with hypoxia (HCC)   COVID-19 virus infection   Pneumonia due to COVID-19 virus   Acute respiratory distress syndrome (ARDS) due to COVID-19 virus (HCC)   Chronic obstructive asthma (with obstructive pulmonary disease) (HCC)   1. Acute on chronic respiratory failure with hypoxia patient is still on oxygen at 2 L plan is going to be to continue with supportive care 2. COVID-19 virus infection treated resolved 3. Pneumonia due to COVID-19 treated clinically is improving 4. ARDS resolving 5. Chronic obstructive asthma patient is at baseline nebulizers as necessary   I have personally seen and evaluated the patient, evaluated laboratory and imaging results, formulated the assessment and plan and placed orders. The Patient requires high complexity decision making with multiple systems involvement.  Rounds were done with the Respiratory Therapy Director and Staff therapists and discussed with nursing staff also.  Yevonne Pax, MD [facility] Pulmonary Critical Care Medicine Sleep Medicine

## 2019-11-28 ENCOUNTER — Other Ambulatory Visit (HOSPITAL_COMMUNITY): Payer: Managed Care, Other (non HMO)

## 2019-11-28 DIAGNOSIS — U071 COVID-19: Secondary | ICD-10-CM | POA: Diagnosis not present

## 2019-11-28 DIAGNOSIS — J80 Acute respiratory distress syndrome: Secondary | ICD-10-CM | POA: Diagnosis not present

## 2019-11-28 DIAGNOSIS — J9621 Acute and chronic respiratory failure with hypoxia: Secondary | ICD-10-CM | POA: Diagnosis not present

## 2019-11-28 DIAGNOSIS — J449 Chronic obstructive pulmonary disease, unspecified: Secondary | ICD-10-CM | POA: Diagnosis not present

## 2019-11-28 NOTE — Progress Notes (Addendum)
Pulmonary Critical Care Medicine [facility] [place]   PULMONARY CRITICAL CARE SERVICE  PROGRESS NOTE  Date of Service: 11/28/2019  Diana Bass  [id number]  DOB: 12/31/1975   DOA: 11/10/2019  Referring Physician: Carron Curie, MD  HPI: Diana Bass is a 44 y.o. female seen for follow up of Acute on Chronic Respiratory Failure.  At this time patient is decannulated she is on about 2 L of oxygen which is gradually being weaned  Medications: Reviewed on Rounds  Physical Exam:  Vitals: Temperature is 97.6 pulse 110 respiratory rate 24 blood pressure 146/89 saturations 98%  Ventilator Settings decannulated off the ventilator  . General: Comfortable at this time . Eyes: Grossly normal lids, irises & conjunctiva . ENT: grossly tongue is normal . Neck: no obvious mass . Cardiovascular: S1 S2 normal no gallop . Respiratory: No rhonchi coarse breath sounds . Abdomen: soft . Skin: no rash seen on limited exam . Musculoskeletal: not rigid . Psychiatric:unable to assess . Neurologic: no seizure no involuntary movements         Lab Data:   Basic Metabolic Panel: No results for input(s): NA, K, CL, CO2, GLUCOSE, BUN, CREATININE, CALCIUM, MG, PHOS in the last 168 hours.  ABG: No results for input(s): PHART, PCO2ART, PO2ART, HCO3, O2SAT in the last 168 hours.  Liver Function Tests: No results for input(s): AST, ALT, ALKPHOS, BILITOT, PROT, ALBUMIN in the last 168 hours. No results for input(s): LIPASE, AMYLASE in the last 168 hours. No results for input(s): AMMONIA in the last 168 hours.  CBC: No results for input(s): WBC, NEUTROABS, HGB, HCT, MCV, PLT in the last 168 hours.  Cardiac Enzymes: No results for input(s): CKTOTAL, CKMB, CKMBINDEX, TROPONINI in the last 168 hours.  BNP (last 3 results) No results for input(s): BNP in the last 8760 hours.  ProBNP (last 3 results) No results for input(s): PROBNP in the last 8760 hours.  Radiological  Exams: No results found.  Assessment/Plan Active Problems:   Acute on chronic respiratory failure with hypoxia (HCC)   COVID-19 virus infection   Pneumonia due to COVID-19 virus   Acute respiratory distress syndrome (ARDS) due to COVID-19 virus (HCC)   Chronic obstructive asthma (with obstructive pulmonary disease) (HCC)   1. Acute on chronic respiratory failure with hypoxia plan is to proceed with weaning FiO2 down as tolerated 2. COVID-19 virus infection resolved 3. Pneumonia due to COVID-19 treated resolving 4. ARDS improving clinically 5. Chronic obstructive asthma patient is at baseline   I have personally seen and evaluated the patient, evaluated laboratory and imaging results, formulated the assessment and plan and placed orders. The Patient requires high complexity decision making with multiple systems involvement.  Rounds were done with the Respiratory Therapy Director and Staff therapists and discussed with nursing staff also.  Yevonne Pax, MD [facility] Pulmonary Critical Care Medicine Sleep Medicine

## 2019-11-29 DIAGNOSIS — J449 Chronic obstructive pulmonary disease, unspecified: Secondary | ICD-10-CM | POA: Diagnosis not present

## 2019-11-29 DIAGNOSIS — U071 COVID-19: Secondary | ICD-10-CM | POA: Diagnosis not present

## 2019-11-29 DIAGNOSIS — J80 Acute respiratory distress syndrome: Secondary | ICD-10-CM | POA: Diagnosis not present

## 2019-11-29 DIAGNOSIS — J9621 Acute and chronic respiratory failure with hypoxia: Secondary | ICD-10-CM | POA: Diagnosis not present

## 2019-11-29 NOTE — Progress Notes (Addendum)
Pulmonary Critical Care Medicine [facility] [place]   PULMONARY CRITICAL CARE SERVICE  PROGRESS NOTE  Date of Service: 11/29/2019  Diana Bass  [id number]  DOB: 17-Feb-1976   DOA: 11/10/2019  Referring Physician: Carron Curie, MD  HPI: Diana Bass is a 44 y.o. female seen for follow up of Acute on Chronic Respiratory Failure.  Patient is on 2 L nasal cannula satting well no fever distress.  Medications: Reviewed on Rounds  Physical Exam:  Vitals: Pulse 98 respirations 18 BP 160/88 O2 sat 99% temp seven-point  Ventilator Settings 2 L nasal cannula  . General: Comfortable at this time . Eyes: Grossly normal lids, irises & conjunctiva . ENT: grossly tongue is normal . Neck: no obvious mass . Cardiovascular: S1 S2 normal no gallop . Respiratory: No rales or rhonchi noted . Abdomen: soft . Skin: no rash seen on limited exam . Musculoskeletal: not rigid . Psychiatric:unable to assess . Neurologic: no seizure no involuntary movements         Lab Data:   Basic Metabolic Panel: No results for input(s): NA, K, CL, CO2, GLUCOSE, BUN, CREATININE, CALCIUM, MG, PHOS in the last 168 hours.  ABG: No results for input(s): PHART, PCO2ART, PO2ART, HCO3, O2SAT in the last 168 hours.  Liver Function Tests: No results for input(s): AST, ALT, ALKPHOS, BILITOT, PROT, ALBUMIN in the last 168 hours. No results for input(s): LIPASE, AMYLASE in the last 168 hours. No results for input(s): AMMONIA in the last 168 hours.  CBC: No results for input(s): WBC, NEUTROABS, HGB, HCT, MCV, PLT in the last 168 hours.  Cardiac Enzymes: No results for input(s): CKTOTAL, CKMB, CKMBINDEX, TROPONINI in the last 168 hours.  BNP (last 3 results) No results for input(s): BNP in the last 8760 hours.  ProBNP (last 3 results) No results for input(s): PROBNP in the last 8760 hours.  Radiological Exams: No results found.  Assessment/Plan Active Problems:   Acute on  chronic respiratory failure with hypoxia (HCC)   COVID-19 virus infection   Pneumonia due to COVID-19 virus   Acute respiratory distress syndrome (ARDS) due to COVID-19 virus (HCC)   Chronic obstructive asthma (with obstructive pulmonary disease) (HCC)   1. Acute on chronic respiratory failure with hypoxia plan is to proceed with weaning FiO2 down as tolerated 2. COVID-19 virus infection resolved 3. Pneumonia due to COVID-19 treated resolving 4. ARDS improving clinically 5. Chronic obstructive asthma patient is at baseline   I have personally seen and evaluated the patient, evaluated laboratory and imaging results, formulated the assessment and plan and placed orders. The Patient requires high complexity decision making with multiple systems involvement.  Rounds were done with the Respiratory Therapy Director and Staff therapists and discussed with nursing staff also.  Yevonne Pax, MD [facility] Pulmonary Critical Care Medicine Sleep Medicine

## 2019-11-30 LAB — BASIC METABOLIC PANEL
Anion gap: 13 (ref 5–15)
BUN: 16 mg/dL (ref 6–20)
CO2: 32 mmol/L (ref 22–32)
Calcium: 8.9 mg/dL (ref 8.9–10.3)
Chloride: 95 mmol/L — ABNORMAL LOW (ref 98–111)
Creatinine, Ser: 0.57 mg/dL (ref 0.44–1.00)
GFR calc Af Amer: >60 mL/min (ref >60)
GFR calc non Af Amer: >60 mL/min (ref >60)
Glucose, Bld: 103 mg/dL — ABNORMAL HIGH (ref 70–99)
Potassium: 3.6 mmol/L (ref 3.5–5.1)
Sodium: 140 mmol/L (ref 135–145)

## 2019-11-30 LAB — CBC
HCT: 38.3 % (ref 36.0–46.0)
Hemoglobin: 11.9 g/dL — ABNORMAL LOW (ref 12.0–15.0)
MCH: 29.1 pg (ref 26.0–34.0)
MCHC: 31.1 g/dL (ref 30.0–36.0)
MCV: 93.6 fL (ref 80.0–100.0)
Platelets: 286 10*3/uL (ref 150–400)
RBC: 4.09 MIL/uL (ref 3.87–5.11)
RDW: 17 % — ABNORMAL HIGH (ref 11.5–15.5)
WBC: 8.4 10*3/uL (ref 4.0–10.5)
nRBC: 0 % (ref 0.0–0.2)

## 2019-12-01 DIAGNOSIS — J9621 Acute and chronic respiratory failure with hypoxia: Secondary | ICD-10-CM | POA: Diagnosis not present

## 2019-12-01 DIAGNOSIS — J449 Chronic obstructive pulmonary disease, unspecified: Secondary | ICD-10-CM | POA: Diagnosis not present

## 2019-12-01 DIAGNOSIS — U071 COVID-19: Secondary | ICD-10-CM | POA: Diagnosis not present

## 2019-12-01 DIAGNOSIS — J80 Acute respiratory distress syndrome: Secondary | ICD-10-CM | POA: Diagnosis not present

## 2019-12-01 LAB — NOVEL CORONAVIRUS, NAA (HOSP ORDER, SEND-OUT TO REF LAB; TAT 18-24 HRS): SARS-CoV-2, NAA: NOT DETECTED

## 2019-12-01 NOTE — Progress Notes (Signed)
Pulmonary Critical Care Medicine [facility] [place]   PULMONARY CRITICAL CARE SERVICE  PROGRESS NOTE  Date of Service: 12/01/2019  Diana Bass  MOQ:[id number]  DOB: 08/04/1976   DOA: 11/10/2019  Referring Physician: Carron Curie, MD  HPI: Diana Bass is a 44 y.o. female seen for follow up of Acute on Chronic Respiratory Failure.  She is on 2 L of O2 doing fairly well.  She still has some issues with desaturations which are being monitored closely  Medications: Reviewed on Rounds  Physical Exam:  Vitals: Temperature 96.8 pulse 104 respiratory 18 blood pressure 156/95 saturations 98%  Ventilator Settings on 2 L O2  . General: Comfortable at this time . Eyes: Grossly normal lids, irises & conjunctiva . ENT: grossly tongue is normal . Neck: no obvious mass . Cardiovascular: S1 S2 normal no gallop . Respiratory: No rhonchi no rales are noted at this time . Abdomen: soft . Skin: no rash seen on limited exam . Musculoskeletal: not rigid . Psychiatric:unable to assess . Neurologic: no seizure no involuntary movements         Lab Data:   Basic Metabolic Panel: Recent Labs  Lab 11/30/19 0447  NA 140  K 3.6  CL 95*  CO2 32  GLUCOSE 103*  BUN 16  CREATININE 0.57  CALCIUM 8.9    ABG: No results for input(s): PHART, PCO2ART, PO2ART, HCO3, O2SAT in the last 168 hours.  Liver Function Tests: No results for input(s): AST, ALT, ALKPHOS, BILITOT, PROT, ALBUMIN in the last 168 hours. No results for input(s): LIPASE, AMYLASE in the last 168 hours. No results for input(s): AMMONIA in the last 168 hours.  CBC: Recent Labs  Lab 11/30/19 0447  WBC 8.4  HGB 11.9*  HCT 38.3  MCV 93.6  PLT 286    Cardiac Enzymes: No results for input(s): CKTOTAL, CKMB, CKMBINDEX, TROPONINI in the last 168 hours.  BNP (last 3 results) No results for input(s): BNP in the last 8760 hours.  ProBNP (last 3 results) No results for input(s): PROBNP in the last  8760 hours.  Radiological Exams: No results found.  Assessment/Plan Active Problems:   Acute on chronic respiratory failure with hypoxia (HCC)   COVID-19 virus infection   Pneumonia due to COVID-19 virus   Acute respiratory distress syndrome (ARDS) due to COVID-19 virus (HCC)   Chronic obstructive asthma (with obstructive pulmonary disease) (HCC)   1. Acute on chronic respiratory failure with hypoxia patient is going to continue with 2 L of oxygen titrate oxygen as tolerated 2. COVID-19 virus infection in resolution phase 3. Pneumonia due to COVID-19 treated 4. ARDS at baseline 5. Chronic obstructive asthma no changes noted clinically improving   I have personally seen and evaluated the patient, evaluated laboratory and imaging results, formulated the assessment and plan and placed orders. The Patient requires high complexity decision making with multiple systems involvement.  Rounds were done with the Respiratory Therapy Director and Staff therapists and discussed with nursing staff also.  Yevonne Pax, MD [facility] Pulmonary Critical Care Medicine Sleep Medicine

## 2019-12-02 DIAGNOSIS — J449 Chronic obstructive pulmonary disease, unspecified: Secondary | ICD-10-CM | POA: Diagnosis not present

## 2019-12-02 DIAGNOSIS — J80 Acute respiratory distress syndrome: Secondary | ICD-10-CM | POA: Diagnosis not present

## 2019-12-02 DIAGNOSIS — J9621 Acute and chronic respiratory failure with hypoxia: Secondary | ICD-10-CM | POA: Diagnosis not present

## 2019-12-02 DIAGNOSIS — U071 COVID-19: Secondary | ICD-10-CM | POA: Diagnosis not present

## 2019-12-02 NOTE — Progress Notes (Signed)
Pulmonary Critical Care Medicine [facility] [place]   PULMONARY CRITICAL CARE SERVICE  PROGRESS NOTE  Date of Service: 12/02/2019  Diana Bass  [id number]  DOB: Oct 28, 1975   DOA: 11/10/2019  Referring Physician: Carron Curie, MD  HPI: Diana Bass is a 44 y.o. female seen for follow up of Acute on Chronic Respiratory Failure.  Patient currently is on 2 L of O2 she is doing well working with therapy  Medications: Reviewed on Rounds  Physical Exam:  Vitals: Temperature is 97.0 pulse 93 respiratory 18 blood pressure is 154/86 saturations 97%  Ventilator Settings off the ventilator on 2 L  . General: Comfortable at this time . Eyes: Grossly normal lids, irises & conjunctiva . ENT: grossly tongue is normal . Neck: no obvious mass . Cardiovascular: S1 S2 normal no gallop . Respiratory: No rhonchi no rales are noted at this time . Abdomen: soft . Skin: no rash seen on limited exam . Musculoskeletal: not rigid . Psychiatric:unable to assess . Neurologic: no seizure no involuntary movements         Lab Data:   Basic Metabolic Panel: Recent Labs  Lab 11/30/19 0447  NA 140  K 3.6  CL 95*  CO2 32  GLUCOSE 103*  BUN 16  CREATININE 0.57  CALCIUM 8.9    ABG: No results for input(s): PHART, PCO2ART, PO2ART, HCO3, O2SAT in the last 168 hours.  Liver Function Tests: No results for input(s): AST, ALT, ALKPHOS, BILITOT, PROT, ALBUMIN in the last 168 hours. No results for input(s): LIPASE, AMYLASE in the last 168 hours. No results for input(s): AMMONIA in the last 168 hours.  CBC: Recent Labs  Lab 11/30/19 0447  WBC 8.4  HGB 11.9*  HCT 38.3  MCV 93.6  PLT 286    Cardiac Enzymes: No results for input(s): CKTOTAL, CKMB, CKMBINDEX, TROPONINI in the last 168 hours.  BNP (last 3 results) No results for input(s): BNP in the last 8760 hours.  ProBNP (last 3 results) No results for input(s): PROBNP in the last 8760 hours.  Radiological  Exams: No results found.  Assessment/Plan Active Problems:   Acute on chronic respiratory failure with hypoxia (HCC)   COVID-19 virus infection   Pneumonia due to COVID-19 virus   Acute respiratory distress syndrome (ARDS) due to COVID-19 virus (HCC)   Chronic obstructive asthma (with obstructive pulmonary disease) (HCC)   1. Acute on chronic respiratory failure hypoxia continue with 2 L O2 titrate as tolerated 2. COVID-19 virus infection treated in resolution phase 3. Pneumonia due to COVID-19 improving 4. ARDS "continue with supportive care 5. Chronic obstructive asthma at baseline nebulizers as necessary   I have personally seen and evaluated the patient, evaluated laboratory and imaging results, formulated the assessment and plan and placed orders. The Patient requires high complexity decision making with multiple systems involvement.  Rounds were done with the Respiratory Therapy Director and Staff therapists and discussed with nursing staff also.  Yevonne Pax, MD [facility] [place] Pulmonary Critical Care Medicine Sleep Medicine

## 2019-12-03 DIAGNOSIS — J80 Acute respiratory distress syndrome: Secondary | ICD-10-CM | POA: Diagnosis not present

## 2019-12-03 DIAGNOSIS — J449 Chronic obstructive pulmonary disease, unspecified: Secondary | ICD-10-CM | POA: Diagnosis not present

## 2019-12-03 DIAGNOSIS — U071 COVID-19: Secondary | ICD-10-CM | POA: Diagnosis not present

## 2019-12-03 DIAGNOSIS — J9621 Acute and chronic respiratory failure with hypoxia: Secondary | ICD-10-CM | POA: Diagnosis not present

## 2019-12-03 NOTE — Progress Notes (Addendum)
Pulmonary Critical Care Medicine [facility] [place]   PULMONARY CRITICAL CARE SERVICE  PROGRESS NOTE  Date of Service: 12/03/2019  Diana Bass  [id number]:[id number]  DOB: November 13, 1975   DOA: 11/10/2019  Referring Physician: Carron Curie, MD  HPI: Diana Bass is a 44 y.o. female seen for follow up of Acute on Chronic Respiratory Failure.  Patient remains on 2 L nasal cannula this time she was decannulated on November 26, 2019.   Medications: Reviewed on Rounds  Physical Exam:  Vitals: Pulse 104 respirations 19 BP 154/96 O2 sat 100% temp 97.4  Ventilator Settings 2 L nasal cannula  . General: Comfortable at this time . Eyes: Grossly normal lids, irises & conjunctiva . ENT: grossly tongue is normal . Neck: no obvious mass . Cardiovascular: S1 S2 normal no gallop . Respiratory: No rales or rhonchi noted . Abdomen: soft . Skin: no rash seen on limited exam . Musculoskeletal: not rigid . Psychiatric:unable to assess . Neurologic: no seizure no involuntary movements         Lab Data:   Basic Metabolic Panel: Recent Labs  Lab 11/30/19 0447  NA 140  K 3.6  CL 95*  CO2 32  GLUCOSE 103*  BUN 16  CREATININE 0.57  CALCIUM 8.9    ABG: No results for input(s): PHART, PCO2ART, PO2ART, HCO3, O2SAT in the last 168 hours.  Liver Function Tests: No results for input(s): AST, ALT, ALKPHOS, BILITOT, PROT, ALBUMIN in the last 168 hours. No results for input(s): LIPASE, AMYLASE in the last 168 hours. No results for input(s): AMMONIA in the last 168 hours.  CBC: Recent Labs  Lab 11/30/19 0447  WBC 8.4  HGB 11.9*  HCT 38.3  MCV 93.6  PLT 286    Cardiac Enzymes: No results for input(s): CKTOTAL, CKMB, CKMBINDEX, TROPONINI in the last 168 hours.  BNP (last 3 results) No results for input(s): BNP in the last 8760 hours.  ProBNP (last 3 results) No results for input(s): PROBNP in the last 8760 hours.  Radiological Exams: No results  found.  Assessment/Plan Active Problems:   Acute on chronic respiratory failure with hypoxia (HCC)   COVID-19 virus infection   Pneumonia due to COVID-19 virus   Acute respiratory distress syndrome (ARDS) due to COVID-19 virus (HCC)   Chronic obstructive asthma (with obstructive pulmonary disease) (HCC)   1. Acute on chronic respiratory failure with hypoxia patient remains decannulated at this time on 2 L of cannula we will continue supportive measures and continue to wean oxygen as tolerated.  Continue supportive measures pulmonary toilet. 2. COVID-19 virus infection resolved 3. Pneumonia due to COVID-19 treated resolving 4. ARDS improving clinically 5. Chronic obstructive asthma patient is at baseline   I have personally seen and evaluated the patient, evaluated laboratory and imaging results, formulated the assessment and plan and placed orders. The Patient requires high complexity decision making with multiple systems involvement.  Rounds were done with the Respiratory Therapy Director and Staff therapists and discussed with nursing staff also.  Yevonne Pax, MD [facility] Pulmonary Critical Care Medicine Sleep Medicine

## 2022-01-29 IMAGING — DX DG CHEST 1V PORT
1 series · 1 of 1 positions shown · non-contrast
Comparison: 11/10/2019

CLINICAL DATA: [id number] pneumonia

EXAM:
PORTABLE CHEST 1 VIEW

[chest]
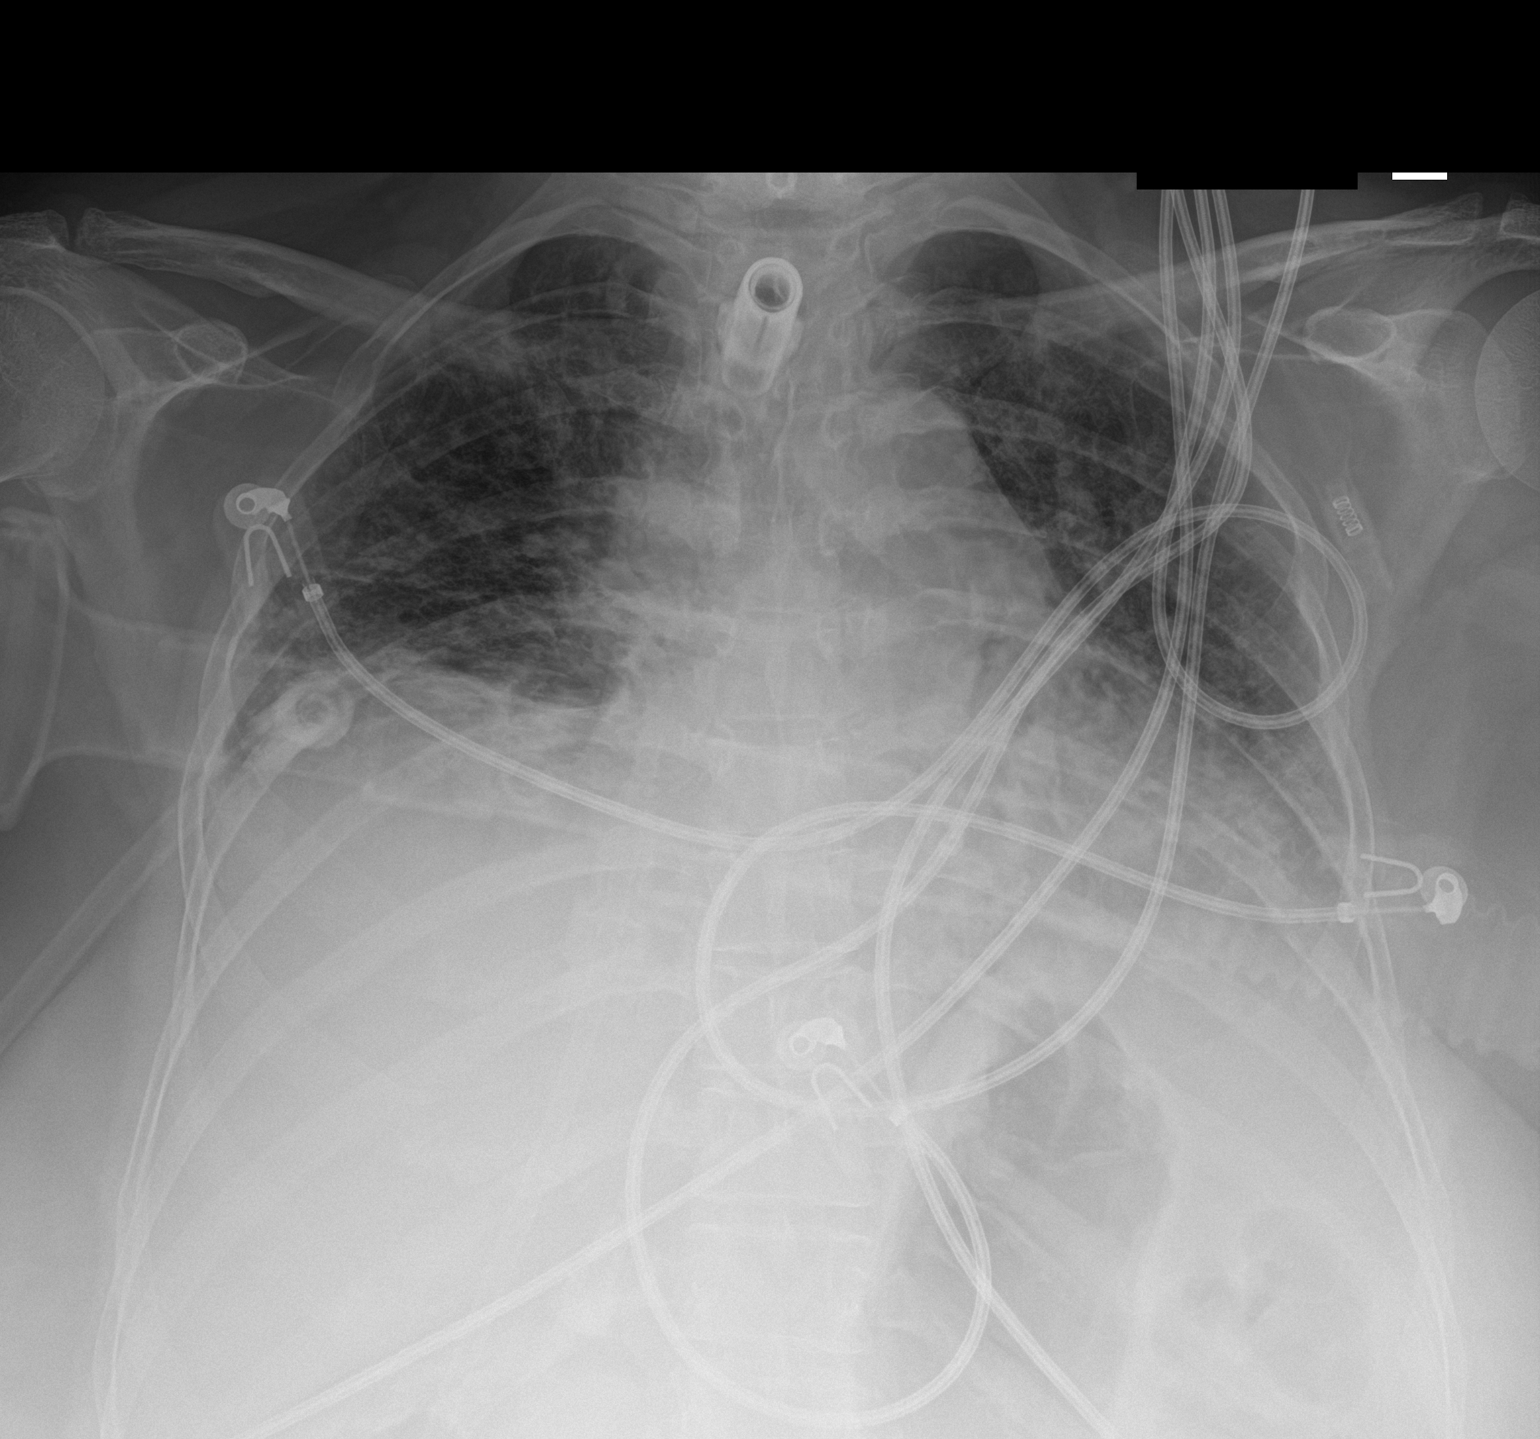

[1 of 1 positions shown; findings below may reference images not displayed]

FINDINGS: Tracheostomy tube noted. Very low lung volumes are present.

Improved bilateral interstitial and airspace opacity compared to
prior.

Mild enlargement of the cardiopericardial silhouette.

Interval removal of the right central lung.
IMPRESSION: 1. Improved bilateral interstitial and airspace opacities.
2. Mild enlargement of the cardiopericardial silhouette.
3. Tracheostomy tube noted.
